# Patient Record
Sex: Male | Born: 1985
Health system: Southern US, Community
[De-identification: ages and names within clinical notes are randomized; demographics above are authoritative.]

---

## 2015-10-25 DIAGNOSIS — J3081 Allergic rhinitis due to animal (cat) (dog) hair and dander: Secondary | ICD-10-CM | POA: Diagnosis not present

## 2015-10-25 DIAGNOSIS — J301 Allergic rhinitis due to pollen: Secondary | ICD-10-CM | POA: Diagnosis not present

## 2015-10-25 DIAGNOSIS — J3089 Other allergic rhinitis: Secondary | ICD-10-CM | POA: Diagnosis not present

## 2015-11-01 DIAGNOSIS — J3081 Allergic rhinitis due to animal (cat) (dog) hair and dander: Secondary | ICD-10-CM | POA: Diagnosis not present

## 2015-11-01 DIAGNOSIS — J301 Allergic rhinitis due to pollen: Secondary | ICD-10-CM | POA: Diagnosis not present

## 2015-11-01 DIAGNOSIS — J3089 Other allergic rhinitis: Secondary | ICD-10-CM | POA: Diagnosis not present

## 2015-11-08 DIAGNOSIS — J3081 Allergic rhinitis due to animal (cat) (dog) hair and dander: Secondary | ICD-10-CM | POA: Diagnosis not present

## 2015-11-08 DIAGNOSIS — J3089 Other allergic rhinitis: Secondary | ICD-10-CM | POA: Diagnosis not present

## 2015-11-08 DIAGNOSIS — J301 Allergic rhinitis due to pollen: Secondary | ICD-10-CM | POA: Diagnosis not present

## 2015-11-15 DIAGNOSIS — J301 Allergic rhinitis due to pollen: Secondary | ICD-10-CM | POA: Diagnosis not present

## 2015-11-15 DIAGNOSIS — J3089 Other allergic rhinitis: Secondary | ICD-10-CM | POA: Diagnosis not present

## 2015-11-15 DIAGNOSIS — J3081 Allergic rhinitis due to animal (cat) (dog) hair and dander: Secondary | ICD-10-CM | POA: Diagnosis not present

## 2015-11-22 DIAGNOSIS — J301 Allergic rhinitis due to pollen: Secondary | ICD-10-CM | POA: Diagnosis not present

## 2015-11-22 DIAGNOSIS — J3089 Other allergic rhinitis: Secondary | ICD-10-CM | POA: Diagnosis not present

## 2015-11-22 DIAGNOSIS — J3081 Allergic rhinitis due to animal (cat) (dog) hair and dander: Secondary | ICD-10-CM | POA: Diagnosis not present

## 2015-11-29 DIAGNOSIS — J3081 Allergic rhinitis due to animal (cat) (dog) hair and dander: Secondary | ICD-10-CM | POA: Diagnosis not present

## 2015-11-29 DIAGNOSIS — J3089 Other allergic rhinitis: Secondary | ICD-10-CM | POA: Diagnosis not present

## 2015-11-29 DIAGNOSIS — J301 Allergic rhinitis due to pollen: Secondary | ICD-10-CM | POA: Diagnosis not present

## 2015-12-06 DIAGNOSIS — J3089 Other allergic rhinitis: Secondary | ICD-10-CM | POA: Diagnosis not present

## 2015-12-06 DIAGNOSIS — J301 Allergic rhinitis due to pollen: Secondary | ICD-10-CM | POA: Diagnosis not present

## 2015-12-06 DIAGNOSIS — J3081 Allergic rhinitis due to animal (cat) (dog) hair and dander: Secondary | ICD-10-CM | POA: Diagnosis not present

## 2015-12-13 DIAGNOSIS — J3081 Allergic rhinitis due to animal (cat) (dog) hair and dander: Secondary | ICD-10-CM | POA: Diagnosis not present

## 2015-12-13 DIAGNOSIS — J301 Allergic rhinitis due to pollen: Secondary | ICD-10-CM | POA: Diagnosis not present

## 2015-12-13 DIAGNOSIS — J3089 Other allergic rhinitis: Secondary | ICD-10-CM | POA: Diagnosis not present

## 2015-12-21 DIAGNOSIS — J301 Allergic rhinitis due to pollen: Secondary | ICD-10-CM | POA: Diagnosis not present

## 2015-12-21 DIAGNOSIS — J3081 Allergic rhinitis due to animal (cat) (dog) hair and dander: Secondary | ICD-10-CM | POA: Diagnosis not present

## 2015-12-21 DIAGNOSIS — J3089 Other allergic rhinitis: Secondary | ICD-10-CM | POA: Diagnosis not present

## 2015-12-27 DIAGNOSIS — J3081 Allergic rhinitis due to animal (cat) (dog) hair and dander: Secondary | ICD-10-CM | POA: Diagnosis not present

## 2015-12-27 DIAGNOSIS — J301 Allergic rhinitis due to pollen: Secondary | ICD-10-CM | POA: Diagnosis not present

## 2015-12-27 DIAGNOSIS — J3089 Other allergic rhinitis: Secondary | ICD-10-CM | POA: Diagnosis not present

## 2015-12-31 DIAGNOSIS — H1045 Other chronic allergic conjunctivitis: Secondary | ICD-10-CM | POA: Diagnosis not present

## 2015-12-31 DIAGNOSIS — J301 Allergic rhinitis due to pollen: Secondary | ICD-10-CM | POA: Diagnosis not present

## 2015-12-31 DIAGNOSIS — J3081 Allergic rhinitis due to animal (cat) (dog) hair and dander: Secondary | ICD-10-CM | POA: Diagnosis not present

## 2015-12-31 DIAGNOSIS — J3089 Other allergic rhinitis: Secondary | ICD-10-CM | POA: Diagnosis not present

## 2016-01-03 DIAGNOSIS — J3081 Allergic rhinitis due to animal (cat) (dog) hair and dander: Secondary | ICD-10-CM | POA: Diagnosis not present

## 2016-01-03 DIAGNOSIS — J3089 Other allergic rhinitis: Secondary | ICD-10-CM | POA: Diagnosis not present

## 2016-01-03 DIAGNOSIS — J301 Allergic rhinitis due to pollen: Secondary | ICD-10-CM | POA: Diagnosis not present

## 2016-01-10 DIAGNOSIS — J3089 Other allergic rhinitis: Secondary | ICD-10-CM | POA: Diagnosis not present

## 2016-01-10 DIAGNOSIS — J301 Allergic rhinitis due to pollen: Secondary | ICD-10-CM | POA: Diagnosis not present

## 2016-01-10 DIAGNOSIS — J3081 Allergic rhinitis due to animal (cat) (dog) hair and dander: Secondary | ICD-10-CM | POA: Diagnosis not present

## 2016-01-13 DIAGNOSIS — J301 Allergic rhinitis due to pollen: Secondary | ICD-10-CM | POA: Diagnosis not present

## 2016-01-13 DIAGNOSIS — J3081 Allergic rhinitis due to animal (cat) (dog) hair and dander: Secondary | ICD-10-CM | POA: Diagnosis not present

## 2016-01-13 DIAGNOSIS — J3089 Other allergic rhinitis: Secondary | ICD-10-CM | POA: Diagnosis not present

## 2016-01-17 DIAGNOSIS — J301 Allergic rhinitis due to pollen: Secondary | ICD-10-CM | POA: Diagnosis not present

## 2016-01-17 DIAGNOSIS — J3081 Allergic rhinitis due to animal (cat) (dog) hair and dander: Secondary | ICD-10-CM | POA: Diagnosis not present

## 2016-01-17 DIAGNOSIS — J3089 Other allergic rhinitis: Secondary | ICD-10-CM | POA: Diagnosis not present

## 2016-01-26 DIAGNOSIS — J3081 Allergic rhinitis due to animal (cat) (dog) hair and dander: Secondary | ICD-10-CM | POA: Diagnosis not present

## 2016-01-26 DIAGNOSIS — J301 Allergic rhinitis due to pollen: Secondary | ICD-10-CM | POA: Diagnosis not present

## 2016-01-26 DIAGNOSIS — J3089 Other allergic rhinitis: Secondary | ICD-10-CM | POA: Diagnosis not present

## 2016-01-31 DIAGNOSIS — J301 Allergic rhinitis due to pollen: Secondary | ICD-10-CM | POA: Diagnosis not present

## 2016-01-31 DIAGNOSIS — J3081 Allergic rhinitis due to animal (cat) (dog) hair and dander: Secondary | ICD-10-CM | POA: Diagnosis not present

## 2016-01-31 DIAGNOSIS — J3089 Other allergic rhinitis: Secondary | ICD-10-CM | POA: Diagnosis not present

## 2016-02-07 DIAGNOSIS — J3081 Allergic rhinitis due to animal (cat) (dog) hair and dander: Secondary | ICD-10-CM | POA: Diagnosis not present

## 2016-02-07 DIAGNOSIS — J3089 Other allergic rhinitis: Secondary | ICD-10-CM | POA: Diagnosis not present

## 2016-02-07 DIAGNOSIS — J301 Allergic rhinitis due to pollen: Secondary | ICD-10-CM | POA: Diagnosis not present

## 2016-02-09 DIAGNOSIS — J3089 Other allergic rhinitis: Secondary | ICD-10-CM | POA: Diagnosis not present

## 2016-02-09 DIAGNOSIS — J301 Allergic rhinitis due to pollen: Secondary | ICD-10-CM | POA: Diagnosis not present

## 2016-02-09 DIAGNOSIS — J3081 Allergic rhinitis due to animal (cat) (dog) hair and dander: Secondary | ICD-10-CM | POA: Diagnosis not present

## 2016-02-11 DIAGNOSIS — J301 Allergic rhinitis due to pollen: Secondary | ICD-10-CM | POA: Diagnosis not present

## 2016-02-11 DIAGNOSIS — J3081 Allergic rhinitis due to animal (cat) (dog) hair and dander: Secondary | ICD-10-CM | POA: Diagnosis not present

## 2016-02-11 DIAGNOSIS — J3089 Other allergic rhinitis: Secondary | ICD-10-CM | POA: Diagnosis not present

## 2016-02-21 DIAGNOSIS — J301 Allergic rhinitis due to pollen: Secondary | ICD-10-CM | POA: Diagnosis not present

## 2016-02-21 DIAGNOSIS — J3089 Other allergic rhinitis: Secondary | ICD-10-CM | POA: Diagnosis not present

## 2016-02-21 DIAGNOSIS — J3081 Allergic rhinitis due to animal (cat) (dog) hair and dander: Secondary | ICD-10-CM | POA: Diagnosis not present

## 2016-02-28 DIAGNOSIS — J3081 Allergic rhinitis due to animal (cat) (dog) hair and dander: Secondary | ICD-10-CM | POA: Diagnosis not present

## 2016-02-28 DIAGNOSIS — J301 Allergic rhinitis due to pollen: Secondary | ICD-10-CM | POA: Diagnosis not present

## 2016-02-28 DIAGNOSIS — J3089 Other allergic rhinitis: Secondary | ICD-10-CM | POA: Diagnosis not present

## 2016-03-06 DIAGNOSIS — J301 Allergic rhinitis due to pollen: Secondary | ICD-10-CM | POA: Diagnosis not present

## 2016-03-06 DIAGNOSIS — J3081 Allergic rhinitis due to animal (cat) (dog) hair and dander: Secondary | ICD-10-CM | POA: Diagnosis not present

## 2016-03-06 DIAGNOSIS — J3089 Other allergic rhinitis: Secondary | ICD-10-CM | POA: Diagnosis not present

## 2016-03-13 DIAGNOSIS — J3081 Allergic rhinitis due to animal (cat) (dog) hair and dander: Secondary | ICD-10-CM | POA: Diagnosis not present

## 2016-03-13 DIAGNOSIS — J3089 Other allergic rhinitis: Secondary | ICD-10-CM | POA: Diagnosis not present

## 2016-03-13 DIAGNOSIS — J301 Allergic rhinitis due to pollen: Secondary | ICD-10-CM | POA: Diagnosis not present

## 2016-03-14 DIAGNOSIS — J301 Allergic rhinitis due to pollen: Secondary | ICD-10-CM | POA: Diagnosis not present

## 2016-03-14 DIAGNOSIS — J3081 Allergic rhinitis due to animal (cat) (dog) hair and dander: Secondary | ICD-10-CM | POA: Diagnosis not present

## 2016-03-15 DIAGNOSIS — J3089 Other allergic rhinitis: Secondary | ICD-10-CM | POA: Diagnosis not present

## 2016-03-20 DIAGNOSIS — J301 Allergic rhinitis due to pollen: Secondary | ICD-10-CM | POA: Diagnosis not present

## 2016-03-20 DIAGNOSIS — J3081 Allergic rhinitis due to animal (cat) (dog) hair and dander: Secondary | ICD-10-CM | POA: Diagnosis not present

## 2016-03-20 DIAGNOSIS — J3089 Other allergic rhinitis: Secondary | ICD-10-CM | POA: Diagnosis not present

## 2016-03-28 DIAGNOSIS — J3089 Other allergic rhinitis: Secondary | ICD-10-CM | POA: Diagnosis not present

## 2016-03-28 DIAGNOSIS — J3081 Allergic rhinitis due to animal (cat) (dog) hair and dander: Secondary | ICD-10-CM | POA: Diagnosis not present

## 2016-03-28 DIAGNOSIS — J301 Allergic rhinitis due to pollen: Secondary | ICD-10-CM | POA: Diagnosis not present

## 2016-04-03 DIAGNOSIS — J3081 Allergic rhinitis due to animal (cat) (dog) hair and dander: Secondary | ICD-10-CM | POA: Diagnosis not present

## 2016-04-03 DIAGNOSIS — J3089 Other allergic rhinitis: Secondary | ICD-10-CM | POA: Diagnosis not present

## 2016-04-03 DIAGNOSIS — J301 Allergic rhinitis due to pollen: Secondary | ICD-10-CM | POA: Diagnosis not present

## 2016-04-10 DIAGNOSIS — J3089 Other allergic rhinitis: Secondary | ICD-10-CM | POA: Diagnosis not present

## 2016-04-10 DIAGNOSIS — J3081 Allergic rhinitis due to animal (cat) (dog) hair and dander: Secondary | ICD-10-CM | POA: Diagnosis not present

## 2016-04-10 DIAGNOSIS — J301 Allergic rhinitis due to pollen: Secondary | ICD-10-CM | POA: Diagnosis not present

## 2016-04-17 DIAGNOSIS — J3089 Other allergic rhinitis: Secondary | ICD-10-CM | POA: Diagnosis not present

## 2016-04-17 DIAGNOSIS — J301 Allergic rhinitis due to pollen: Secondary | ICD-10-CM | POA: Diagnosis not present

## 2016-04-17 DIAGNOSIS — J3081 Allergic rhinitis due to animal (cat) (dog) hair and dander: Secondary | ICD-10-CM | POA: Diagnosis not present

## 2016-04-24 DIAGNOSIS — J3081 Allergic rhinitis due to animal (cat) (dog) hair and dander: Secondary | ICD-10-CM | POA: Diagnosis not present

## 2016-04-24 DIAGNOSIS — J301 Allergic rhinitis due to pollen: Secondary | ICD-10-CM | POA: Diagnosis not present

## 2016-04-24 DIAGNOSIS — J3089 Other allergic rhinitis: Secondary | ICD-10-CM | POA: Diagnosis not present

## 2016-05-01 DIAGNOSIS — J301 Allergic rhinitis due to pollen: Secondary | ICD-10-CM | POA: Diagnosis not present

## 2016-05-01 DIAGNOSIS — J3081 Allergic rhinitis due to animal (cat) (dog) hair and dander: Secondary | ICD-10-CM | POA: Diagnosis not present

## 2016-05-01 DIAGNOSIS — J3089 Other allergic rhinitis: Secondary | ICD-10-CM | POA: Diagnosis not present

## 2016-05-08 DIAGNOSIS — J3089 Other allergic rhinitis: Secondary | ICD-10-CM | POA: Diagnosis not present

## 2016-05-08 DIAGNOSIS — J3081 Allergic rhinitis due to animal (cat) (dog) hair and dander: Secondary | ICD-10-CM | POA: Diagnosis not present

## 2016-05-08 DIAGNOSIS — J301 Allergic rhinitis due to pollen: Secondary | ICD-10-CM | POA: Diagnosis not present

## 2016-05-15 DIAGNOSIS — J301 Allergic rhinitis due to pollen: Secondary | ICD-10-CM | POA: Diagnosis not present

## 2016-05-15 DIAGNOSIS — J3081 Allergic rhinitis due to animal (cat) (dog) hair and dander: Secondary | ICD-10-CM | POA: Diagnosis not present

## 2016-05-15 DIAGNOSIS — J3089 Other allergic rhinitis: Secondary | ICD-10-CM | POA: Diagnosis not present

## 2016-05-22 DIAGNOSIS — J301 Allergic rhinitis due to pollen: Secondary | ICD-10-CM | POA: Diagnosis not present

## 2016-05-22 DIAGNOSIS — J3089 Other allergic rhinitis: Secondary | ICD-10-CM | POA: Diagnosis not present

## 2016-05-22 DIAGNOSIS — J3081 Allergic rhinitis due to animal (cat) (dog) hair and dander: Secondary | ICD-10-CM | POA: Diagnosis not present

## 2016-05-29 DIAGNOSIS — J3089 Other allergic rhinitis: Secondary | ICD-10-CM | POA: Diagnosis not present

## 2016-05-29 DIAGNOSIS — J3081 Allergic rhinitis due to animal (cat) (dog) hair and dander: Secondary | ICD-10-CM | POA: Diagnosis not present

## 2016-05-29 DIAGNOSIS — J301 Allergic rhinitis due to pollen: Secondary | ICD-10-CM | POA: Diagnosis not present

## 2016-06-05 DIAGNOSIS — J3089 Other allergic rhinitis: Secondary | ICD-10-CM | POA: Diagnosis not present

## 2016-06-05 DIAGNOSIS — J301 Allergic rhinitis due to pollen: Secondary | ICD-10-CM | POA: Diagnosis not present

## 2016-06-05 DIAGNOSIS — J3081 Allergic rhinitis due to animal (cat) (dog) hair and dander: Secondary | ICD-10-CM | POA: Diagnosis not present

## 2016-06-12 DIAGNOSIS — J301 Allergic rhinitis due to pollen: Secondary | ICD-10-CM | POA: Diagnosis not present

## 2016-06-12 DIAGNOSIS — J3089 Other allergic rhinitis: Secondary | ICD-10-CM | POA: Diagnosis not present

## 2016-06-12 DIAGNOSIS — J3081 Allergic rhinitis due to animal (cat) (dog) hair and dander: Secondary | ICD-10-CM | POA: Diagnosis not present

## 2016-06-13 DIAGNOSIS — J302 Other seasonal allergic rhinitis: Secondary | ICD-10-CM | POA: Diagnosis not present

## 2016-06-13 DIAGNOSIS — R030 Elevated blood-pressure reading, without diagnosis of hypertension: Secondary | ICD-10-CM | POA: Diagnosis not present

## 2016-06-13 DIAGNOSIS — F900 Attention-deficit hyperactivity disorder, predominantly inattentive type: Secondary | ICD-10-CM | POA: Diagnosis not present

## 2016-06-13 DIAGNOSIS — Z0000 Encounter for general adult medical examination without abnormal findings: Secondary | ICD-10-CM | POA: Diagnosis not present

## 2016-06-13 DIAGNOSIS — R509 Fever, unspecified: Secondary | ICD-10-CM | POA: Diagnosis not present

## 2016-06-19 DIAGNOSIS — J3089 Other allergic rhinitis: Secondary | ICD-10-CM | POA: Diagnosis not present

## 2016-06-19 DIAGNOSIS — J3081 Allergic rhinitis due to animal (cat) (dog) hair and dander: Secondary | ICD-10-CM | POA: Diagnosis not present

## 2016-06-19 DIAGNOSIS — J301 Allergic rhinitis due to pollen: Secondary | ICD-10-CM | POA: Diagnosis not present

## 2016-06-27 DIAGNOSIS — J3089 Other allergic rhinitis: Secondary | ICD-10-CM | POA: Diagnosis not present

## 2016-06-27 DIAGNOSIS — J3081 Allergic rhinitis due to animal (cat) (dog) hair and dander: Secondary | ICD-10-CM | POA: Diagnosis not present

## 2016-06-27 DIAGNOSIS — J301 Allergic rhinitis due to pollen: Secondary | ICD-10-CM | POA: Diagnosis not present

## 2016-07-03 DIAGNOSIS — J301 Allergic rhinitis due to pollen: Secondary | ICD-10-CM | POA: Diagnosis not present

## 2016-07-03 DIAGNOSIS — J3089 Other allergic rhinitis: Secondary | ICD-10-CM | POA: Diagnosis not present

## 2016-07-03 DIAGNOSIS — J3081 Allergic rhinitis due to animal (cat) (dog) hair and dander: Secondary | ICD-10-CM | POA: Diagnosis not present

## 2016-07-10 DIAGNOSIS — J301 Allergic rhinitis due to pollen: Secondary | ICD-10-CM | POA: Diagnosis not present

## 2016-07-10 DIAGNOSIS — J3089 Other allergic rhinitis: Secondary | ICD-10-CM | POA: Diagnosis not present

## 2016-07-10 DIAGNOSIS — J3081 Allergic rhinitis due to animal (cat) (dog) hair and dander: Secondary | ICD-10-CM | POA: Diagnosis not present

## 2016-07-14 DIAGNOSIS — J3089 Other allergic rhinitis: Secondary | ICD-10-CM | POA: Diagnosis not present

## 2016-07-14 DIAGNOSIS — J301 Allergic rhinitis due to pollen: Secondary | ICD-10-CM | POA: Diagnosis not present

## 2016-07-14 DIAGNOSIS — J3081 Allergic rhinitis due to animal (cat) (dog) hair and dander: Secondary | ICD-10-CM | POA: Diagnosis not present

## 2016-07-21 DIAGNOSIS — J3081 Allergic rhinitis due to animal (cat) (dog) hair and dander: Secondary | ICD-10-CM | POA: Diagnosis not present

## 2016-07-21 DIAGNOSIS — J3089 Other allergic rhinitis: Secondary | ICD-10-CM | POA: Diagnosis not present

## 2016-07-21 DIAGNOSIS — J301 Allergic rhinitis due to pollen: Secondary | ICD-10-CM | POA: Diagnosis not present

## 2016-07-31 DIAGNOSIS — J3081 Allergic rhinitis due to animal (cat) (dog) hair and dander: Secondary | ICD-10-CM | POA: Diagnosis not present

## 2016-07-31 DIAGNOSIS — J301 Allergic rhinitis due to pollen: Secondary | ICD-10-CM | POA: Diagnosis not present

## 2016-07-31 DIAGNOSIS — J3089 Other allergic rhinitis: Secondary | ICD-10-CM | POA: Diagnosis not present

## 2016-08-04 DIAGNOSIS — Z6827 Body mass index (BMI) 27.0-27.9, adult: Secondary | ICD-10-CM | POA: Diagnosis not present

## 2016-08-04 DIAGNOSIS — R030 Elevated blood-pressure reading, without diagnosis of hypertension: Secondary | ICD-10-CM | POA: Diagnosis not present

## 2016-08-07 DIAGNOSIS — J301 Allergic rhinitis due to pollen: Secondary | ICD-10-CM | POA: Diagnosis not present

## 2016-08-07 DIAGNOSIS — J3089 Other allergic rhinitis: Secondary | ICD-10-CM | POA: Diagnosis not present

## 2016-08-07 DIAGNOSIS — J3081 Allergic rhinitis due to animal (cat) (dog) hair and dander: Secondary | ICD-10-CM | POA: Diagnosis not present

## 2016-08-14 DIAGNOSIS — J3081 Allergic rhinitis due to animal (cat) (dog) hair and dander: Secondary | ICD-10-CM | POA: Diagnosis not present

## 2016-08-14 DIAGNOSIS — J3089 Other allergic rhinitis: Secondary | ICD-10-CM | POA: Diagnosis not present

## 2016-08-14 DIAGNOSIS — J301 Allergic rhinitis due to pollen: Secondary | ICD-10-CM | POA: Diagnosis not present

## 2016-08-21 DIAGNOSIS — J3089 Other allergic rhinitis: Secondary | ICD-10-CM | POA: Diagnosis not present

## 2016-08-21 DIAGNOSIS — J3081 Allergic rhinitis due to animal (cat) (dog) hair and dander: Secondary | ICD-10-CM | POA: Diagnosis not present

## 2016-08-21 DIAGNOSIS — J301 Allergic rhinitis due to pollen: Secondary | ICD-10-CM | POA: Diagnosis not present

## 2016-08-28 DIAGNOSIS — J3081 Allergic rhinitis due to animal (cat) (dog) hair and dander: Secondary | ICD-10-CM | POA: Diagnosis not present

## 2016-08-28 DIAGNOSIS — J301 Allergic rhinitis due to pollen: Secondary | ICD-10-CM | POA: Diagnosis not present

## 2016-08-28 DIAGNOSIS — J3089 Other allergic rhinitis: Secondary | ICD-10-CM | POA: Diagnosis not present

## 2016-08-29 DIAGNOSIS — J3081 Allergic rhinitis due to animal (cat) (dog) hair and dander: Secondary | ICD-10-CM | POA: Diagnosis not present

## 2016-08-29 DIAGNOSIS — J301 Allergic rhinitis due to pollen: Secondary | ICD-10-CM | POA: Diagnosis not present

## 2016-08-30 DIAGNOSIS — J3089 Other allergic rhinitis: Secondary | ICD-10-CM | POA: Diagnosis not present

## 2016-09-04 DIAGNOSIS — J301 Allergic rhinitis due to pollen: Secondary | ICD-10-CM | POA: Diagnosis not present

## 2016-09-04 DIAGNOSIS — J3089 Other allergic rhinitis: Secondary | ICD-10-CM | POA: Diagnosis not present

## 2016-09-04 DIAGNOSIS — J3081 Allergic rhinitis due to animal (cat) (dog) hair and dander: Secondary | ICD-10-CM | POA: Diagnosis not present

## 2016-09-06 DIAGNOSIS — J019 Acute sinusitis, unspecified: Secondary | ICD-10-CM | POA: Diagnosis not present

## 2016-09-06 DIAGNOSIS — R42 Dizziness and giddiness: Secondary | ICD-10-CM | POA: Diagnosis not present

## 2016-09-06 DIAGNOSIS — I10 Essential (primary) hypertension: Secondary | ICD-10-CM | POA: Diagnosis not present

## 2016-09-06 DIAGNOSIS — Z6827 Body mass index (BMI) 27.0-27.9, adult: Secondary | ICD-10-CM | POA: Diagnosis not present

## 2016-09-11 DIAGNOSIS — J3089 Other allergic rhinitis: Secondary | ICD-10-CM | POA: Diagnosis not present

## 2016-09-11 DIAGNOSIS — J301 Allergic rhinitis due to pollen: Secondary | ICD-10-CM | POA: Diagnosis not present

## 2016-09-11 DIAGNOSIS — J3081 Allergic rhinitis due to animal (cat) (dog) hair and dander: Secondary | ICD-10-CM | POA: Diagnosis not present

## 2016-09-18 DIAGNOSIS — J3089 Other allergic rhinitis: Secondary | ICD-10-CM | POA: Diagnosis not present

## 2016-09-18 DIAGNOSIS — J301 Allergic rhinitis due to pollen: Secondary | ICD-10-CM | POA: Diagnosis not present

## 2016-09-18 DIAGNOSIS — J3081 Allergic rhinitis due to animal (cat) (dog) hair and dander: Secondary | ICD-10-CM | POA: Diagnosis not present

## 2016-09-22 DIAGNOSIS — Z6827 Body mass index (BMI) 27.0-27.9, adult: Secondary | ICD-10-CM | POA: Diagnosis not present

## 2016-09-22 DIAGNOSIS — R42 Dizziness and giddiness: Secondary | ICD-10-CM | POA: Diagnosis not present

## 2016-09-22 DIAGNOSIS — I10 Essential (primary) hypertension: Secondary | ICD-10-CM | POA: Diagnosis not present

## 2016-09-25 DIAGNOSIS — J3081 Allergic rhinitis due to animal (cat) (dog) hair and dander: Secondary | ICD-10-CM | POA: Diagnosis not present

## 2016-09-25 DIAGNOSIS — J3089 Other allergic rhinitis: Secondary | ICD-10-CM | POA: Diagnosis not present

## 2016-09-25 DIAGNOSIS — J301 Allergic rhinitis due to pollen: Secondary | ICD-10-CM | POA: Diagnosis not present

## 2016-09-28 DIAGNOSIS — J3089 Other allergic rhinitis: Secondary | ICD-10-CM | POA: Diagnosis not present

## 2016-09-28 DIAGNOSIS — J301 Allergic rhinitis due to pollen: Secondary | ICD-10-CM | POA: Diagnosis not present

## 2016-09-28 DIAGNOSIS — J3081 Allergic rhinitis due to animal (cat) (dog) hair and dander: Secondary | ICD-10-CM | POA: Diagnosis not present

## 2016-10-04 DIAGNOSIS — J3089 Other allergic rhinitis: Secondary | ICD-10-CM | POA: Diagnosis not present

## 2016-10-04 DIAGNOSIS — J301 Allergic rhinitis due to pollen: Secondary | ICD-10-CM | POA: Diagnosis not present

## 2016-10-04 DIAGNOSIS — J3081 Allergic rhinitis due to animal (cat) (dog) hair and dander: Secondary | ICD-10-CM | POA: Diagnosis not present

## 2016-10-11 DIAGNOSIS — J3089 Other allergic rhinitis: Secondary | ICD-10-CM | POA: Diagnosis not present

## 2016-10-11 DIAGNOSIS — J301 Allergic rhinitis due to pollen: Secondary | ICD-10-CM | POA: Diagnosis not present

## 2016-10-11 DIAGNOSIS — J3081 Allergic rhinitis due to animal (cat) (dog) hair and dander: Secondary | ICD-10-CM | POA: Diagnosis not present

## 2016-10-16 DIAGNOSIS — J3089 Other allergic rhinitis: Secondary | ICD-10-CM | POA: Diagnosis not present

## 2016-10-16 DIAGNOSIS — J301 Allergic rhinitis due to pollen: Secondary | ICD-10-CM | POA: Diagnosis not present

## 2016-10-16 DIAGNOSIS — J3081 Allergic rhinitis due to animal (cat) (dog) hair and dander: Secondary | ICD-10-CM | POA: Diagnosis not present

## 2016-10-23 DIAGNOSIS — J301 Allergic rhinitis due to pollen: Secondary | ICD-10-CM | POA: Diagnosis not present

## 2016-10-23 DIAGNOSIS — J3089 Other allergic rhinitis: Secondary | ICD-10-CM | POA: Diagnosis not present

## 2016-10-23 DIAGNOSIS — J3081 Allergic rhinitis due to animal (cat) (dog) hair and dander: Secondary | ICD-10-CM | POA: Diagnosis not present

## 2016-10-30 DIAGNOSIS — J301 Allergic rhinitis due to pollen: Secondary | ICD-10-CM | POA: Diagnosis not present

## 2016-10-30 DIAGNOSIS — J3081 Allergic rhinitis due to animal (cat) (dog) hair and dander: Secondary | ICD-10-CM | POA: Diagnosis not present

## 2016-10-30 DIAGNOSIS — J3089 Other allergic rhinitis: Secondary | ICD-10-CM | POA: Diagnosis not present

## 2016-11-06 DIAGNOSIS — J3089 Other allergic rhinitis: Secondary | ICD-10-CM | POA: Diagnosis not present

## 2016-11-06 DIAGNOSIS — J301 Allergic rhinitis due to pollen: Secondary | ICD-10-CM | POA: Diagnosis not present

## 2016-11-06 DIAGNOSIS — J3081 Allergic rhinitis due to animal (cat) (dog) hair and dander: Secondary | ICD-10-CM | POA: Diagnosis not present

## 2016-11-13 DIAGNOSIS — J3081 Allergic rhinitis due to animal (cat) (dog) hair and dander: Secondary | ICD-10-CM | POA: Diagnosis not present

## 2016-11-13 DIAGNOSIS — J3089 Other allergic rhinitis: Secondary | ICD-10-CM | POA: Diagnosis not present

## 2016-11-13 DIAGNOSIS — J301 Allergic rhinitis due to pollen: Secondary | ICD-10-CM | POA: Diagnosis not present

## 2016-11-20 DIAGNOSIS — J3081 Allergic rhinitis due to animal (cat) (dog) hair and dander: Secondary | ICD-10-CM | POA: Diagnosis not present

## 2016-11-20 DIAGNOSIS — J301 Allergic rhinitis due to pollen: Secondary | ICD-10-CM | POA: Diagnosis not present

## 2016-11-20 DIAGNOSIS — J3089 Other allergic rhinitis: Secondary | ICD-10-CM | POA: Diagnosis not present

## 2016-11-27 DIAGNOSIS — J301 Allergic rhinitis due to pollen: Secondary | ICD-10-CM | POA: Diagnosis not present

## 2016-11-27 DIAGNOSIS — J3089 Other allergic rhinitis: Secondary | ICD-10-CM | POA: Diagnosis not present

## 2016-11-27 DIAGNOSIS — J3081 Allergic rhinitis due to animal (cat) (dog) hair and dander: Secondary | ICD-10-CM | POA: Diagnosis not present

## 2016-12-04 DIAGNOSIS — J3089 Other allergic rhinitis: Secondary | ICD-10-CM | POA: Diagnosis not present

## 2016-12-04 DIAGNOSIS — J301 Allergic rhinitis due to pollen: Secondary | ICD-10-CM | POA: Diagnosis not present

## 2016-12-04 DIAGNOSIS — J3081 Allergic rhinitis due to animal (cat) (dog) hair and dander: Secondary | ICD-10-CM | POA: Diagnosis not present

## 2016-12-11 DIAGNOSIS — J301 Allergic rhinitis due to pollen: Secondary | ICD-10-CM | POA: Diagnosis not present

## 2016-12-11 DIAGNOSIS — J3089 Other allergic rhinitis: Secondary | ICD-10-CM | POA: Diagnosis not present

## 2016-12-11 DIAGNOSIS — J3081 Allergic rhinitis due to animal (cat) (dog) hair and dander: Secondary | ICD-10-CM | POA: Diagnosis not present

## 2016-12-19 DIAGNOSIS — J301 Allergic rhinitis due to pollen: Secondary | ICD-10-CM | POA: Diagnosis not present

## 2016-12-19 DIAGNOSIS — J3081 Allergic rhinitis due to animal (cat) (dog) hair and dander: Secondary | ICD-10-CM | POA: Diagnosis not present

## 2016-12-19 DIAGNOSIS — J3089 Other allergic rhinitis: Secondary | ICD-10-CM | POA: Diagnosis not present

## 2016-12-21 DIAGNOSIS — I10 Essential (primary) hypertension: Secondary | ICD-10-CM | POA: Diagnosis not present

## 2016-12-21 DIAGNOSIS — Z0000 Encounter for general adult medical examination without abnormal findings: Secondary | ICD-10-CM | POA: Diagnosis not present

## 2016-12-25 DIAGNOSIS — J3081 Allergic rhinitis due to animal (cat) (dog) hair and dander: Secondary | ICD-10-CM | POA: Diagnosis not present

## 2016-12-25 DIAGNOSIS — J301 Allergic rhinitis due to pollen: Secondary | ICD-10-CM | POA: Diagnosis not present

## 2016-12-25 DIAGNOSIS — J3089 Other allergic rhinitis: Secondary | ICD-10-CM | POA: Diagnosis not present

## 2016-12-27 DIAGNOSIS — Z23 Encounter for immunization: Secondary | ICD-10-CM | POA: Diagnosis not present

## 2016-12-27 DIAGNOSIS — R42 Dizziness and giddiness: Secondary | ICD-10-CM | POA: Diagnosis not present

## 2016-12-27 DIAGNOSIS — Z0000 Encounter for general adult medical examination without abnormal findings: Secondary | ICD-10-CM | POA: Diagnosis not present

## 2016-12-27 DIAGNOSIS — E663 Overweight: Secondary | ICD-10-CM | POA: Diagnosis not present

## 2016-12-27 DIAGNOSIS — I10 Essential (primary) hypertension: Secondary | ICD-10-CM | POA: Diagnosis not present

## 2016-12-27 DIAGNOSIS — Z1389 Encounter for screening for other disorder: Secondary | ICD-10-CM | POA: Diagnosis not present

## 2016-12-27 DIAGNOSIS — J302 Other seasonal allergic rhinitis: Secondary | ICD-10-CM | POA: Diagnosis not present

## 2017-01-01 DIAGNOSIS — J3089 Other allergic rhinitis: Secondary | ICD-10-CM | POA: Diagnosis not present

## 2017-01-01 DIAGNOSIS — J3081 Allergic rhinitis due to animal (cat) (dog) hair and dander: Secondary | ICD-10-CM | POA: Diagnosis not present

## 2017-01-01 DIAGNOSIS — J301 Allergic rhinitis due to pollen: Secondary | ICD-10-CM | POA: Diagnosis not present

## 2017-01-08 DIAGNOSIS — J301 Allergic rhinitis due to pollen: Secondary | ICD-10-CM | POA: Diagnosis not present

## 2017-01-08 DIAGNOSIS — J3089 Other allergic rhinitis: Secondary | ICD-10-CM | POA: Diagnosis not present

## 2017-01-08 DIAGNOSIS — J3081 Allergic rhinitis due to animal (cat) (dog) hair and dander: Secondary | ICD-10-CM | POA: Diagnosis not present

## 2017-01-15 DIAGNOSIS — J3089 Other allergic rhinitis: Secondary | ICD-10-CM | POA: Diagnosis not present

## 2017-01-15 DIAGNOSIS — J301 Allergic rhinitis due to pollen: Secondary | ICD-10-CM | POA: Diagnosis not present

## 2017-01-15 DIAGNOSIS — J3081 Allergic rhinitis due to animal (cat) (dog) hair and dander: Secondary | ICD-10-CM | POA: Diagnosis not present

## 2017-01-17 DIAGNOSIS — J3081 Allergic rhinitis due to animal (cat) (dog) hair and dander: Secondary | ICD-10-CM | POA: Diagnosis not present

## 2017-01-17 DIAGNOSIS — J3089 Other allergic rhinitis: Secondary | ICD-10-CM | POA: Diagnosis not present

## 2017-01-17 DIAGNOSIS — J301 Allergic rhinitis due to pollen: Secondary | ICD-10-CM | POA: Diagnosis not present

## 2017-01-22 DIAGNOSIS — J301 Allergic rhinitis due to pollen: Secondary | ICD-10-CM | POA: Diagnosis not present

## 2017-01-22 DIAGNOSIS — J3089 Other allergic rhinitis: Secondary | ICD-10-CM | POA: Diagnosis not present

## 2017-01-22 DIAGNOSIS — J3081 Allergic rhinitis due to animal (cat) (dog) hair and dander: Secondary | ICD-10-CM | POA: Diagnosis not present

## 2017-01-29 DIAGNOSIS — J3089 Other allergic rhinitis: Secondary | ICD-10-CM | POA: Diagnosis not present

## 2017-01-29 DIAGNOSIS — J3081 Allergic rhinitis due to animal (cat) (dog) hair and dander: Secondary | ICD-10-CM | POA: Diagnosis not present

## 2017-01-29 DIAGNOSIS — J301 Allergic rhinitis due to pollen: Secondary | ICD-10-CM | POA: Diagnosis not present

## 2017-02-05 DIAGNOSIS — J3089 Other allergic rhinitis: Secondary | ICD-10-CM | POA: Diagnosis not present

## 2017-02-05 DIAGNOSIS — J3081 Allergic rhinitis due to animal (cat) (dog) hair and dander: Secondary | ICD-10-CM | POA: Diagnosis not present

## 2017-02-05 DIAGNOSIS — J301 Allergic rhinitis due to pollen: Secondary | ICD-10-CM | POA: Diagnosis not present

## 2017-02-12 DIAGNOSIS — J3089 Other allergic rhinitis: Secondary | ICD-10-CM | POA: Diagnosis not present

## 2017-02-12 DIAGNOSIS — J3081 Allergic rhinitis due to animal (cat) (dog) hair and dander: Secondary | ICD-10-CM | POA: Diagnosis not present

## 2017-02-12 DIAGNOSIS — J301 Allergic rhinitis due to pollen: Secondary | ICD-10-CM | POA: Diagnosis not present

## 2017-02-19 DIAGNOSIS — J3089 Other allergic rhinitis: Secondary | ICD-10-CM | POA: Diagnosis not present

## 2017-02-19 DIAGNOSIS — J301 Allergic rhinitis due to pollen: Secondary | ICD-10-CM | POA: Diagnosis not present

## 2017-02-19 DIAGNOSIS — J3081 Allergic rhinitis due to animal (cat) (dog) hair and dander: Secondary | ICD-10-CM | POA: Diagnosis not present

## 2017-02-26 DIAGNOSIS — J3081 Allergic rhinitis due to animal (cat) (dog) hair and dander: Secondary | ICD-10-CM | POA: Diagnosis not present

## 2017-02-26 DIAGNOSIS — J301 Allergic rhinitis due to pollen: Secondary | ICD-10-CM | POA: Diagnosis not present

## 2017-02-26 DIAGNOSIS — J3089 Other allergic rhinitis: Secondary | ICD-10-CM | POA: Diagnosis not present

## 2017-03-05 DIAGNOSIS — J301 Allergic rhinitis due to pollen: Secondary | ICD-10-CM | POA: Diagnosis not present

## 2017-03-05 DIAGNOSIS — J3081 Allergic rhinitis due to animal (cat) (dog) hair and dander: Secondary | ICD-10-CM | POA: Diagnosis not present

## 2017-03-05 DIAGNOSIS — J3089 Other allergic rhinitis: Secondary | ICD-10-CM | POA: Diagnosis not present

## 2017-03-12 DIAGNOSIS — J3081 Allergic rhinitis due to animal (cat) (dog) hair and dander: Secondary | ICD-10-CM | POA: Diagnosis not present

## 2017-03-12 DIAGNOSIS — J3089 Other allergic rhinitis: Secondary | ICD-10-CM | POA: Diagnosis not present

## 2017-03-12 DIAGNOSIS — J301 Allergic rhinitis due to pollen: Secondary | ICD-10-CM | POA: Diagnosis not present

## 2017-03-15 DIAGNOSIS — J3081 Allergic rhinitis due to animal (cat) (dog) hair and dander: Secondary | ICD-10-CM | POA: Diagnosis not present

## 2017-03-15 DIAGNOSIS — J3089 Other allergic rhinitis: Secondary | ICD-10-CM | POA: Diagnosis not present

## 2017-03-15 DIAGNOSIS — J301 Allergic rhinitis due to pollen: Secondary | ICD-10-CM | POA: Diagnosis not present

## 2017-03-15 DIAGNOSIS — H1045 Other chronic allergic conjunctivitis: Secondary | ICD-10-CM | POA: Diagnosis not present

## 2017-03-19 DIAGNOSIS — J3081 Allergic rhinitis due to animal (cat) (dog) hair and dander: Secondary | ICD-10-CM | POA: Diagnosis not present

## 2017-03-19 DIAGNOSIS — J3089 Other allergic rhinitis: Secondary | ICD-10-CM | POA: Diagnosis not present

## 2017-03-19 DIAGNOSIS — J301 Allergic rhinitis due to pollen: Secondary | ICD-10-CM | POA: Diagnosis not present

## 2017-03-27 DIAGNOSIS — J3089 Other allergic rhinitis: Secondary | ICD-10-CM | POA: Diagnosis not present

## 2017-03-27 DIAGNOSIS — J3081 Allergic rhinitis due to animal (cat) (dog) hair and dander: Secondary | ICD-10-CM | POA: Diagnosis not present

## 2017-03-27 DIAGNOSIS — J301 Allergic rhinitis due to pollen: Secondary | ICD-10-CM | POA: Diagnosis not present

## 2017-04-02 DIAGNOSIS — J3089 Other allergic rhinitis: Secondary | ICD-10-CM | POA: Diagnosis not present

## 2017-04-02 DIAGNOSIS — J301 Allergic rhinitis due to pollen: Secondary | ICD-10-CM | POA: Diagnosis not present

## 2017-04-02 DIAGNOSIS — J3081 Allergic rhinitis due to animal (cat) (dog) hair and dander: Secondary | ICD-10-CM | POA: Diagnosis not present

## 2017-04-11 DIAGNOSIS — J301 Allergic rhinitis due to pollen: Secondary | ICD-10-CM | POA: Diagnosis not present

## 2017-04-11 DIAGNOSIS — J3089 Other allergic rhinitis: Secondary | ICD-10-CM | POA: Diagnosis not present

## 2017-04-11 DIAGNOSIS — J3081 Allergic rhinitis due to animal (cat) (dog) hair and dander: Secondary | ICD-10-CM | POA: Diagnosis not present

## 2017-04-13 DIAGNOSIS — J301 Allergic rhinitis due to pollen: Secondary | ICD-10-CM | POA: Diagnosis not present

## 2017-04-13 DIAGNOSIS — J3081 Allergic rhinitis due to animal (cat) (dog) hair and dander: Secondary | ICD-10-CM | POA: Diagnosis not present

## 2017-04-16 DIAGNOSIS — J3089 Other allergic rhinitis: Secondary | ICD-10-CM | POA: Diagnosis not present

## 2017-04-18 DIAGNOSIS — J3089 Other allergic rhinitis: Secondary | ICD-10-CM | POA: Diagnosis not present

## 2017-04-18 DIAGNOSIS — J3081 Allergic rhinitis due to animal (cat) (dog) hair and dander: Secondary | ICD-10-CM | POA: Diagnosis not present

## 2017-04-18 DIAGNOSIS — J301 Allergic rhinitis due to pollen: Secondary | ICD-10-CM | POA: Diagnosis not present

## 2017-04-25 DIAGNOSIS — J3081 Allergic rhinitis due to animal (cat) (dog) hair and dander: Secondary | ICD-10-CM | POA: Diagnosis not present

## 2017-04-25 DIAGNOSIS — J301 Allergic rhinitis due to pollen: Secondary | ICD-10-CM | POA: Diagnosis not present

## 2017-04-25 DIAGNOSIS — J3089 Other allergic rhinitis: Secondary | ICD-10-CM | POA: Diagnosis not present

## 2017-05-02 DIAGNOSIS — J3089 Other allergic rhinitis: Secondary | ICD-10-CM | POA: Diagnosis not present

## 2017-05-02 DIAGNOSIS — J3081 Allergic rhinitis due to animal (cat) (dog) hair and dander: Secondary | ICD-10-CM | POA: Diagnosis not present

## 2017-05-02 DIAGNOSIS — J301 Allergic rhinitis due to pollen: Secondary | ICD-10-CM | POA: Diagnosis not present

## 2017-05-09 DIAGNOSIS — J3081 Allergic rhinitis due to animal (cat) (dog) hair and dander: Secondary | ICD-10-CM | POA: Diagnosis not present

## 2017-05-09 DIAGNOSIS — J301 Allergic rhinitis due to pollen: Secondary | ICD-10-CM | POA: Diagnosis not present

## 2017-05-09 DIAGNOSIS — J3089 Other allergic rhinitis: Secondary | ICD-10-CM | POA: Diagnosis not present

## 2017-05-16 DIAGNOSIS — J3089 Other allergic rhinitis: Secondary | ICD-10-CM | POA: Diagnosis not present

## 2017-05-16 DIAGNOSIS — J3081 Allergic rhinitis due to animal (cat) (dog) hair and dander: Secondary | ICD-10-CM | POA: Diagnosis not present

## 2017-05-16 DIAGNOSIS — J301 Allergic rhinitis due to pollen: Secondary | ICD-10-CM | POA: Diagnosis not present

## 2017-05-23 DIAGNOSIS — J301 Allergic rhinitis due to pollen: Secondary | ICD-10-CM | POA: Diagnosis not present

## 2017-05-23 DIAGNOSIS — J3081 Allergic rhinitis due to animal (cat) (dog) hair and dander: Secondary | ICD-10-CM | POA: Diagnosis not present

## 2017-05-23 DIAGNOSIS — J3089 Other allergic rhinitis: Secondary | ICD-10-CM | POA: Diagnosis not present

## 2017-05-30 DIAGNOSIS — J3081 Allergic rhinitis due to animal (cat) (dog) hair and dander: Secondary | ICD-10-CM | POA: Diagnosis not present

## 2017-05-30 DIAGNOSIS — J3089 Other allergic rhinitis: Secondary | ICD-10-CM | POA: Diagnosis not present

## 2017-05-30 DIAGNOSIS — J301 Allergic rhinitis due to pollen: Secondary | ICD-10-CM | POA: Diagnosis not present

## 2017-06-06 DIAGNOSIS — J3081 Allergic rhinitis due to animal (cat) (dog) hair and dander: Secondary | ICD-10-CM | POA: Diagnosis not present

## 2017-06-06 DIAGNOSIS — J3089 Other allergic rhinitis: Secondary | ICD-10-CM | POA: Diagnosis not present

## 2017-06-06 DIAGNOSIS — J301 Allergic rhinitis due to pollen: Secondary | ICD-10-CM | POA: Diagnosis not present

## 2017-06-13 DIAGNOSIS — J3089 Other allergic rhinitis: Secondary | ICD-10-CM | POA: Diagnosis not present

## 2017-06-13 DIAGNOSIS — J301 Allergic rhinitis due to pollen: Secondary | ICD-10-CM | POA: Diagnosis not present

## 2017-06-13 DIAGNOSIS — J3081 Allergic rhinitis due to animal (cat) (dog) hair and dander: Secondary | ICD-10-CM | POA: Diagnosis not present

## 2017-06-20 DIAGNOSIS — J3081 Allergic rhinitis due to animal (cat) (dog) hair and dander: Secondary | ICD-10-CM | POA: Diagnosis not present

## 2017-06-20 DIAGNOSIS — J3089 Other allergic rhinitis: Secondary | ICD-10-CM | POA: Diagnosis not present

## 2017-06-20 DIAGNOSIS — J301 Allergic rhinitis due to pollen: Secondary | ICD-10-CM | POA: Diagnosis not present

## 2017-06-27 DIAGNOSIS — J301 Allergic rhinitis due to pollen: Secondary | ICD-10-CM | POA: Diagnosis not present

## 2017-06-27 DIAGNOSIS — J3089 Other allergic rhinitis: Secondary | ICD-10-CM | POA: Diagnosis not present

## 2017-06-27 DIAGNOSIS — J3081 Allergic rhinitis due to animal (cat) (dog) hair and dander: Secondary | ICD-10-CM | POA: Diagnosis not present

## 2017-07-04 DIAGNOSIS — J301 Allergic rhinitis due to pollen: Secondary | ICD-10-CM | POA: Diagnosis not present

## 2017-07-04 DIAGNOSIS — J3081 Allergic rhinitis due to animal (cat) (dog) hair and dander: Secondary | ICD-10-CM | POA: Diagnosis not present

## 2017-07-04 DIAGNOSIS — J3089 Other allergic rhinitis: Secondary | ICD-10-CM | POA: Diagnosis not present

## 2017-07-05 DIAGNOSIS — J302 Other seasonal allergic rhinitis: Secondary | ICD-10-CM | POA: Diagnosis not present

## 2017-07-05 DIAGNOSIS — I10 Essential (primary) hypertension: Secondary | ICD-10-CM | POA: Diagnosis not present

## 2017-07-05 DIAGNOSIS — E663 Overweight: Secondary | ICD-10-CM | POA: Diagnosis not present

## 2017-07-05 DIAGNOSIS — R42 Dizziness and giddiness: Secondary | ICD-10-CM | POA: Diagnosis not present

## 2017-07-11 DIAGNOSIS — J3089 Other allergic rhinitis: Secondary | ICD-10-CM | POA: Diagnosis not present

## 2017-07-11 DIAGNOSIS — J3081 Allergic rhinitis due to animal (cat) (dog) hair and dander: Secondary | ICD-10-CM | POA: Diagnosis not present

## 2017-07-11 DIAGNOSIS — J301 Allergic rhinitis due to pollen: Secondary | ICD-10-CM | POA: Diagnosis not present

## 2017-07-19 DIAGNOSIS — J301 Allergic rhinitis due to pollen: Secondary | ICD-10-CM | POA: Diagnosis not present

## 2017-07-19 DIAGNOSIS — J3089 Other allergic rhinitis: Secondary | ICD-10-CM | POA: Diagnosis not present

## 2017-07-19 DIAGNOSIS — J3081 Allergic rhinitis due to animal (cat) (dog) hair and dander: Secondary | ICD-10-CM | POA: Diagnosis not present

## 2017-07-25 DIAGNOSIS — J3081 Allergic rhinitis due to animal (cat) (dog) hair and dander: Secondary | ICD-10-CM | POA: Diagnosis not present

## 2017-07-25 DIAGNOSIS — J301 Allergic rhinitis due to pollen: Secondary | ICD-10-CM | POA: Diagnosis not present

## 2017-07-25 DIAGNOSIS — J3089 Other allergic rhinitis: Secondary | ICD-10-CM | POA: Diagnosis not present

## 2017-07-31 DIAGNOSIS — J3089 Other allergic rhinitis: Secondary | ICD-10-CM | POA: Diagnosis not present

## 2017-07-31 DIAGNOSIS — J301 Allergic rhinitis due to pollen: Secondary | ICD-10-CM | POA: Diagnosis not present

## 2017-07-31 DIAGNOSIS — J3081 Allergic rhinitis due to animal (cat) (dog) hair and dander: Secondary | ICD-10-CM | POA: Diagnosis not present

## 2017-08-08 DIAGNOSIS — J3089 Other allergic rhinitis: Secondary | ICD-10-CM | POA: Diagnosis not present

## 2017-08-08 DIAGNOSIS — J301 Allergic rhinitis due to pollen: Secondary | ICD-10-CM | POA: Diagnosis not present

## 2017-08-08 DIAGNOSIS — J3081 Allergic rhinitis due to animal (cat) (dog) hair and dander: Secondary | ICD-10-CM | POA: Diagnosis not present

## 2017-08-15 DIAGNOSIS — J3089 Other allergic rhinitis: Secondary | ICD-10-CM | POA: Diagnosis not present

## 2017-08-15 DIAGNOSIS — J3081 Allergic rhinitis due to animal (cat) (dog) hair and dander: Secondary | ICD-10-CM | POA: Diagnosis not present

## 2017-08-15 DIAGNOSIS — J301 Allergic rhinitis due to pollen: Secondary | ICD-10-CM | POA: Diagnosis not present

## 2017-08-22 DIAGNOSIS — J3089 Other allergic rhinitis: Secondary | ICD-10-CM | POA: Diagnosis not present

## 2017-08-22 DIAGNOSIS — J301 Allergic rhinitis due to pollen: Secondary | ICD-10-CM | POA: Diagnosis not present

## 2017-08-22 DIAGNOSIS — J3081 Allergic rhinitis due to animal (cat) (dog) hair and dander: Secondary | ICD-10-CM | POA: Diagnosis not present

## 2017-08-29 DIAGNOSIS — J301 Allergic rhinitis due to pollen: Secondary | ICD-10-CM | POA: Diagnosis not present

## 2017-08-29 DIAGNOSIS — J3081 Allergic rhinitis due to animal (cat) (dog) hair and dander: Secondary | ICD-10-CM | POA: Diagnosis not present

## 2017-08-29 DIAGNOSIS — J3089 Other allergic rhinitis: Secondary | ICD-10-CM | POA: Diagnosis not present

## 2017-09-05 DIAGNOSIS — J301 Allergic rhinitis due to pollen: Secondary | ICD-10-CM | POA: Diagnosis not present

## 2017-09-05 DIAGNOSIS — J3089 Other allergic rhinitis: Secondary | ICD-10-CM | POA: Diagnosis not present

## 2017-09-05 DIAGNOSIS — J3081 Allergic rhinitis due to animal (cat) (dog) hair and dander: Secondary | ICD-10-CM | POA: Diagnosis not present

## 2017-09-12 DIAGNOSIS — J3081 Allergic rhinitis due to animal (cat) (dog) hair and dander: Secondary | ICD-10-CM | POA: Diagnosis not present

## 2017-09-12 DIAGNOSIS — J301 Allergic rhinitis due to pollen: Secondary | ICD-10-CM | POA: Diagnosis not present

## 2017-09-12 DIAGNOSIS — J3089 Other allergic rhinitis: Secondary | ICD-10-CM | POA: Diagnosis not present

## 2017-09-19 DIAGNOSIS — J3081 Allergic rhinitis due to animal (cat) (dog) hair and dander: Secondary | ICD-10-CM | POA: Diagnosis not present

## 2017-09-19 DIAGNOSIS — J3089 Other allergic rhinitis: Secondary | ICD-10-CM | POA: Diagnosis not present

## 2017-09-19 DIAGNOSIS — J301 Allergic rhinitis due to pollen: Secondary | ICD-10-CM | POA: Diagnosis not present

## 2017-09-26 DIAGNOSIS — J301 Allergic rhinitis due to pollen: Secondary | ICD-10-CM | POA: Diagnosis not present

## 2017-09-26 DIAGNOSIS — J3081 Allergic rhinitis due to animal (cat) (dog) hair and dander: Secondary | ICD-10-CM | POA: Diagnosis not present

## 2017-09-26 DIAGNOSIS — J3089 Other allergic rhinitis: Secondary | ICD-10-CM | POA: Diagnosis not present

## 2017-10-03 DIAGNOSIS — J301 Allergic rhinitis due to pollen: Secondary | ICD-10-CM | POA: Diagnosis not present

## 2017-10-03 DIAGNOSIS — J3089 Other allergic rhinitis: Secondary | ICD-10-CM | POA: Diagnosis not present

## 2017-10-03 DIAGNOSIS — J3081 Allergic rhinitis due to animal (cat) (dog) hair and dander: Secondary | ICD-10-CM | POA: Diagnosis not present

## 2017-10-10 DIAGNOSIS — J3089 Other allergic rhinitis: Secondary | ICD-10-CM | POA: Diagnosis not present

## 2017-10-10 DIAGNOSIS — J301 Allergic rhinitis due to pollen: Secondary | ICD-10-CM | POA: Diagnosis not present

## 2017-10-10 DIAGNOSIS — J3081 Allergic rhinitis due to animal (cat) (dog) hair and dander: Secondary | ICD-10-CM | POA: Diagnosis not present

## 2017-10-17 DIAGNOSIS — J301 Allergic rhinitis due to pollen: Secondary | ICD-10-CM | POA: Diagnosis not present

## 2017-10-17 DIAGNOSIS — J3089 Other allergic rhinitis: Secondary | ICD-10-CM | POA: Diagnosis not present

## 2017-10-17 DIAGNOSIS — J3081 Allergic rhinitis due to animal (cat) (dog) hair and dander: Secondary | ICD-10-CM | POA: Diagnosis not present

## 2017-10-18 DIAGNOSIS — J3089 Other allergic rhinitis: Secondary | ICD-10-CM | POA: Diagnosis not present

## 2017-10-22 DIAGNOSIS — I10 Essential (primary) hypertension: Secondary | ICD-10-CM | POA: Diagnosis not present

## 2017-10-22 DIAGNOSIS — R000 Tachycardia, unspecified: Secondary | ICD-10-CM | POA: Diagnosis not present

## 2017-10-22 DIAGNOSIS — F419 Anxiety disorder, unspecified: Secondary | ICD-10-CM | POA: Diagnosis not present

## 2017-10-22 DIAGNOSIS — Z1389 Encounter for screening for other disorder: Secondary | ICD-10-CM | POA: Diagnosis not present

## 2017-10-24 DIAGNOSIS — J3089 Other allergic rhinitis: Secondary | ICD-10-CM | POA: Diagnosis not present

## 2017-10-24 DIAGNOSIS — J301 Allergic rhinitis due to pollen: Secondary | ICD-10-CM | POA: Diagnosis not present

## 2017-10-24 DIAGNOSIS — J3081 Allergic rhinitis due to animal (cat) (dog) hair and dander: Secondary | ICD-10-CM | POA: Diagnosis not present

## 2017-10-31 DIAGNOSIS — J3081 Allergic rhinitis due to animal (cat) (dog) hair and dander: Secondary | ICD-10-CM | POA: Diagnosis not present

## 2017-10-31 DIAGNOSIS — J301 Allergic rhinitis due to pollen: Secondary | ICD-10-CM | POA: Diagnosis not present

## 2017-10-31 DIAGNOSIS — J3089 Other allergic rhinitis: Secondary | ICD-10-CM | POA: Diagnosis not present

## 2017-11-07 DIAGNOSIS — J3089 Other allergic rhinitis: Secondary | ICD-10-CM | POA: Diagnosis not present

## 2017-11-07 DIAGNOSIS — J301 Allergic rhinitis due to pollen: Secondary | ICD-10-CM | POA: Diagnosis not present

## 2017-11-07 DIAGNOSIS — J3081 Allergic rhinitis due to animal (cat) (dog) hair and dander: Secondary | ICD-10-CM | POA: Diagnosis not present

## 2017-11-14 DIAGNOSIS — J301 Allergic rhinitis due to pollen: Secondary | ICD-10-CM | POA: Diagnosis not present

## 2017-11-14 DIAGNOSIS — J3081 Allergic rhinitis due to animal (cat) (dog) hair and dander: Secondary | ICD-10-CM | POA: Diagnosis not present

## 2017-11-14 DIAGNOSIS — J3089 Other allergic rhinitis: Secondary | ICD-10-CM | POA: Diagnosis not present

## 2017-11-21 DIAGNOSIS — J3089 Other allergic rhinitis: Secondary | ICD-10-CM | POA: Diagnosis not present

## 2017-11-21 DIAGNOSIS — J301 Allergic rhinitis due to pollen: Secondary | ICD-10-CM | POA: Diagnosis not present

## 2017-11-21 DIAGNOSIS — J3081 Allergic rhinitis due to animal (cat) (dog) hair and dander: Secondary | ICD-10-CM | POA: Diagnosis not present

## 2017-11-28 DIAGNOSIS — J3089 Other allergic rhinitis: Secondary | ICD-10-CM | POA: Diagnosis not present

## 2017-11-28 DIAGNOSIS — J3081 Allergic rhinitis due to animal (cat) (dog) hair and dander: Secondary | ICD-10-CM | POA: Diagnosis not present

## 2017-11-28 DIAGNOSIS — J301 Allergic rhinitis due to pollen: Secondary | ICD-10-CM | POA: Diagnosis not present

## 2017-12-05 DIAGNOSIS — J3081 Allergic rhinitis due to animal (cat) (dog) hair and dander: Secondary | ICD-10-CM | POA: Diagnosis not present

## 2017-12-05 DIAGNOSIS — J3089 Other allergic rhinitis: Secondary | ICD-10-CM | POA: Diagnosis not present

## 2017-12-05 DIAGNOSIS — J301 Allergic rhinitis due to pollen: Secondary | ICD-10-CM | POA: Diagnosis not present

## 2017-12-12 DIAGNOSIS — J301 Allergic rhinitis due to pollen: Secondary | ICD-10-CM | POA: Diagnosis not present

## 2017-12-12 DIAGNOSIS — J3081 Allergic rhinitis due to animal (cat) (dog) hair and dander: Secondary | ICD-10-CM | POA: Diagnosis not present

## 2017-12-12 DIAGNOSIS — J3089 Other allergic rhinitis: Secondary | ICD-10-CM | POA: Diagnosis not present

## 2017-12-19 DIAGNOSIS — J3081 Allergic rhinitis due to animal (cat) (dog) hair and dander: Secondary | ICD-10-CM | POA: Diagnosis not present

## 2017-12-19 DIAGNOSIS — J301 Allergic rhinitis due to pollen: Secondary | ICD-10-CM | POA: Diagnosis not present

## 2017-12-19 DIAGNOSIS — J3089 Other allergic rhinitis: Secondary | ICD-10-CM | POA: Diagnosis not present

## 2017-12-26 DIAGNOSIS — Z0000 Encounter for general adult medical examination without abnormal findings: Secondary | ICD-10-CM | POA: Diagnosis not present

## 2017-12-26 DIAGNOSIS — J3081 Allergic rhinitis due to animal (cat) (dog) hair and dander: Secondary | ICD-10-CM | POA: Diagnosis not present

## 2017-12-26 DIAGNOSIS — J3089 Other allergic rhinitis: Secondary | ICD-10-CM | POA: Diagnosis not present

## 2017-12-26 DIAGNOSIS — R82998 Other abnormal findings in urine: Secondary | ICD-10-CM | POA: Diagnosis not present

## 2017-12-26 DIAGNOSIS — J301 Allergic rhinitis due to pollen: Secondary | ICD-10-CM | POA: Diagnosis not present

## 2017-12-26 DIAGNOSIS — I10 Essential (primary) hypertension: Secondary | ICD-10-CM | POA: Diagnosis not present

## 2018-01-02 DIAGNOSIS — J3081 Allergic rhinitis due to animal (cat) (dog) hair and dander: Secondary | ICD-10-CM | POA: Diagnosis not present

## 2018-01-02 DIAGNOSIS — J301 Allergic rhinitis due to pollen: Secondary | ICD-10-CM | POA: Diagnosis not present

## 2018-01-02 DIAGNOSIS — F418 Other specified anxiety disorders: Secondary | ICD-10-CM | POA: Diagnosis not present

## 2018-01-02 DIAGNOSIS — Z1331 Encounter for screening for depression: Secondary | ICD-10-CM | POA: Diagnosis not present

## 2018-01-02 DIAGNOSIS — I10 Essential (primary) hypertension: Secondary | ICD-10-CM | POA: Diagnosis not present

## 2018-01-02 DIAGNOSIS — Z8679 Personal history of other diseases of the circulatory system: Secondary | ICD-10-CM | POA: Diagnosis not present

## 2018-01-02 DIAGNOSIS — J3089 Other allergic rhinitis: Secondary | ICD-10-CM | POA: Diagnosis not present

## 2018-01-02 DIAGNOSIS — E663 Overweight: Secondary | ICD-10-CM | POA: Diagnosis not present

## 2018-01-02 DIAGNOSIS — Z0000 Encounter for general adult medical examination without abnormal findings: Secondary | ICD-10-CM | POA: Diagnosis not present

## 2018-01-09 DIAGNOSIS — J3089 Other allergic rhinitis: Secondary | ICD-10-CM | POA: Diagnosis not present

## 2018-01-09 DIAGNOSIS — J3081 Allergic rhinitis due to animal (cat) (dog) hair and dander: Secondary | ICD-10-CM | POA: Diagnosis not present

## 2018-01-09 DIAGNOSIS — J301 Allergic rhinitis due to pollen: Secondary | ICD-10-CM | POA: Diagnosis not present

## 2018-01-16 DIAGNOSIS — J301 Allergic rhinitis due to pollen: Secondary | ICD-10-CM | POA: Diagnosis not present

## 2018-01-16 DIAGNOSIS — J3089 Other allergic rhinitis: Secondary | ICD-10-CM | POA: Diagnosis not present

## 2018-01-16 DIAGNOSIS — J3081 Allergic rhinitis due to animal (cat) (dog) hair and dander: Secondary | ICD-10-CM | POA: Diagnosis not present

## 2018-01-23 DIAGNOSIS — J3089 Other allergic rhinitis: Secondary | ICD-10-CM | POA: Diagnosis not present

## 2018-01-23 DIAGNOSIS — J301 Allergic rhinitis due to pollen: Secondary | ICD-10-CM | POA: Diagnosis not present

## 2018-01-23 DIAGNOSIS — J3081 Allergic rhinitis due to animal (cat) (dog) hair and dander: Secondary | ICD-10-CM | POA: Diagnosis not present

## 2018-01-30 DIAGNOSIS — J3081 Allergic rhinitis due to animal (cat) (dog) hair and dander: Secondary | ICD-10-CM | POA: Diagnosis not present

## 2018-01-30 DIAGNOSIS — J3089 Other allergic rhinitis: Secondary | ICD-10-CM | POA: Diagnosis not present

## 2018-01-30 DIAGNOSIS — J301 Allergic rhinitis due to pollen: Secondary | ICD-10-CM | POA: Diagnosis not present

## 2018-02-06 DIAGNOSIS — J3081 Allergic rhinitis due to animal (cat) (dog) hair and dander: Secondary | ICD-10-CM | POA: Diagnosis not present

## 2018-02-06 DIAGNOSIS — J3089 Other allergic rhinitis: Secondary | ICD-10-CM | POA: Diagnosis not present

## 2018-02-06 DIAGNOSIS — J301 Allergic rhinitis due to pollen: Secondary | ICD-10-CM | POA: Diagnosis not present

## 2018-02-19 DIAGNOSIS — J3089 Other allergic rhinitis: Secondary | ICD-10-CM | POA: Diagnosis not present

## 2018-02-19 DIAGNOSIS — J301 Allergic rhinitis due to pollen: Secondary | ICD-10-CM | POA: Diagnosis not present

## 2018-02-19 DIAGNOSIS — J3081 Allergic rhinitis due to animal (cat) (dog) hair and dander: Secondary | ICD-10-CM | POA: Diagnosis not present

## 2018-02-27 DIAGNOSIS — J301 Allergic rhinitis due to pollen: Secondary | ICD-10-CM | POA: Diagnosis not present

## 2018-02-27 DIAGNOSIS — J3089 Other allergic rhinitis: Secondary | ICD-10-CM | POA: Diagnosis not present

## 2018-02-27 DIAGNOSIS — J3081 Allergic rhinitis due to animal (cat) (dog) hair and dander: Secondary | ICD-10-CM | POA: Diagnosis not present

## 2018-03-05 DIAGNOSIS — J3081 Allergic rhinitis due to animal (cat) (dog) hair and dander: Secondary | ICD-10-CM | POA: Diagnosis not present

## 2018-03-05 DIAGNOSIS — J301 Allergic rhinitis due to pollen: Secondary | ICD-10-CM | POA: Diagnosis not present

## 2018-03-05 DIAGNOSIS — J3089 Other allergic rhinitis: Secondary | ICD-10-CM | POA: Diagnosis not present

## 2018-03-05 DIAGNOSIS — H1045 Other chronic allergic conjunctivitis: Secondary | ICD-10-CM | POA: Diagnosis not present

## 2018-03-13 DIAGNOSIS — J3089 Other allergic rhinitis: Secondary | ICD-10-CM | POA: Diagnosis not present

## 2018-03-13 DIAGNOSIS — J301 Allergic rhinitis due to pollen: Secondary | ICD-10-CM | POA: Diagnosis not present

## 2018-03-13 DIAGNOSIS — J3081 Allergic rhinitis due to animal (cat) (dog) hair and dander: Secondary | ICD-10-CM | POA: Diagnosis not present

## 2018-03-20 DIAGNOSIS — J301 Allergic rhinitis due to pollen: Secondary | ICD-10-CM | POA: Diagnosis not present

## 2018-03-20 DIAGNOSIS — J3081 Allergic rhinitis due to animal (cat) (dog) hair and dander: Secondary | ICD-10-CM | POA: Diagnosis not present

## 2018-03-20 DIAGNOSIS — J3089 Other allergic rhinitis: Secondary | ICD-10-CM | POA: Diagnosis not present

## 2018-03-26 DIAGNOSIS — J3081 Allergic rhinitis due to animal (cat) (dog) hair and dander: Secondary | ICD-10-CM | POA: Diagnosis not present

## 2018-03-26 DIAGNOSIS — J301 Allergic rhinitis due to pollen: Secondary | ICD-10-CM | POA: Diagnosis not present

## 2018-03-26 DIAGNOSIS — J3089 Other allergic rhinitis: Secondary | ICD-10-CM | POA: Diagnosis not present

## 2018-03-27 DIAGNOSIS — J3089 Other allergic rhinitis: Secondary | ICD-10-CM | POA: Diagnosis not present

## 2018-03-27 DIAGNOSIS — J3081 Allergic rhinitis due to animal (cat) (dog) hair and dander: Secondary | ICD-10-CM | POA: Diagnosis not present

## 2018-03-27 DIAGNOSIS — J301 Allergic rhinitis due to pollen: Secondary | ICD-10-CM | POA: Diagnosis not present

## 2018-04-03 DIAGNOSIS — J3081 Allergic rhinitis due to animal (cat) (dog) hair and dander: Secondary | ICD-10-CM | POA: Diagnosis not present

## 2018-04-03 DIAGNOSIS — J3089 Other allergic rhinitis: Secondary | ICD-10-CM | POA: Diagnosis not present

## 2018-04-03 DIAGNOSIS — J301 Allergic rhinitis due to pollen: Secondary | ICD-10-CM | POA: Diagnosis not present

## 2018-04-10 DIAGNOSIS — J301 Allergic rhinitis due to pollen: Secondary | ICD-10-CM | POA: Diagnosis not present

## 2018-04-10 DIAGNOSIS — J3081 Allergic rhinitis due to animal (cat) (dog) hair and dander: Secondary | ICD-10-CM | POA: Diagnosis not present

## 2018-04-10 DIAGNOSIS — J3089 Other allergic rhinitis: Secondary | ICD-10-CM | POA: Diagnosis not present

## 2018-04-17 DIAGNOSIS — J3081 Allergic rhinitis due to animal (cat) (dog) hair and dander: Secondary | ICD-10-CM | POA: Diagnosis not present

## 2018-04-17 DIAGNOSIS — J301 Allergic rhinitis due to pollen: Secondary | ICD-10-CM | POA: Diagnosis not present

## 2018-04-17 DIAGNOSIS — J3089 Other allergic rhinitis: Secondary | ICD-10-CM | POA: Diagnosis not present

## 2018-04-24 DIAGNOSIS — J3089 Other allergic rhinitis: Secondary | ICD-10-CM | POA: Diagnosis not present

## 2018-04-24 DIAGNOSIS — J3081 Allergic rhinitis due to animal (cat) (dog) hair and dander: Secondary | ICD-10-CM | POA: Diagnosis not present

## 2018-04-24 DIAGNOSIS — J301 Allergic rhinitis due to pollen: Secondary | ICD-10-CM | POA: Diagnosis not present

## 2018-05-01 DIAGNOSIS — J301 Allergic rhinitis due to pollen: Secondary | ICD-10-CM | POA: Diagnosis not present

## 2018-05-01 DIAGNOSIS — J3081 Allergic rhinitis due to animal (cat) (dog) hair and dander: Secondary | ICD-10-CM | POA: Diagnosis not present

## 2018-05-01 DIAGNOSIS — J3089 Other allergic rhinitis: Secondary | ICD-10-CM | POA: Diagnosis not present

## 2018-05-07 DIAGNOSIS — Z6825 Body mass index (BMI) 25.0-25.9, adult: Secondary | ICD-10-CM | POA: Diagnosis not present

## 2018-05-07 DIAGNOSIS — I10 Essential (primary) hypertension: Secondary | ICD-10-CM | POA: Diagnosis not present

## 2018-05-07 DIAGNOSIS — R42 Dizziness and giddiness: Secondary | ICD-10-CM | POA: Diagnosis not present

## 2018-05-07 DIAGNOSIS — H6593 Unspecified nonsuppurative otitis media, bilateral: Secondary | ICD-10-CM | POA: Diagnosis not present

## 2018-05-08 DIAGNOSIS — J301 Allergic rhinitis due to pollen: Secondary | ICD-10-CM | POA: Diagnosis not present

## 2018-05-08 DIAGNOSIS — J3089 Other allergic rhinitis: Secondary | ICD-10-CM | POA: Diagnosis not present

## 2018-05-08 DIAGNOSIS — J3081 Allergic rhinitis due to animal (cat) (dog) hair and dander: Secondary | ICD-10-CM | POA: Diagnosis not present

## 2018-05-15 DIAGNOSIS — J3089 Other allergic rhinitis: Secondary | ICD-10-CM | POA: Diagnosis not present

## 2018-05-15 DIAGNOSIS — J3081 Allergic rhinitis due to animal (cat) (dog) hair and dander: Secondary | ICD-10-CM | POA: Diagnosis not present

## 2018-05-15 DIAGNOSIS — J301 Allergic rhinitis due to pollen: Secondary | ICD-10-CM | POA: Diagnosis not present

## 2018-05-22 DIAGNOSIS — J301 Allergic rhinitis due to pollen: Secondary | ICD-10-CM | POA: Diagnosis not present

## 2018-05-22 DIAGNOSIS — J3089 Other allergic rhinitis: Secondary | ICD-10-CM | POA: Diagnosis not present

## 2018-05-22 DIAGNOSIS — J3081 Allergic rhinitis due to animal (cat) (dog) hair and dander: Secondary | ICD-10-CM | POA: Diagnosis not present

## 2018-05-23 DIAGNOSIS — J3089 Other allergic rhinitis: Secondary | ICD-10-CM | POA: Diagnosis not present

## 2018-05-29 DIAGNOSIS — J3089 Other allergic rhinitis: Secondary | ICD-10-CM | POA: Diagnosis not present

## 2018-05-29 DIAGNOSIS — J3081 Allergic rhinitis due to animal (cat) (dog) hair and dander: Secondary | ICD-10-CM | POA: Diagnosis not present

## 2018-05-29 DIAGNOSIS — J301 Allergic rhinitis due to pollen: Secondary | ICD-10-CM | POA: Diagnosis not present

## 2018-06-05 DIAGNOSIS — Z6825 Body mass index (BMI) 25.0-25.9, adult: Secondary | ICD-10-CM | POA: Diagnosis not present

## 2018-06-05 DIAGNOSIS — J3081 Allergic rhinitis due to animal (cat) (dog) hair and dander: Secondary | ICD-10-CM | POA: Diagnosis not present

## 2018-06-05 DIAGNOSIS — J3089 Other allergic rhinitis: Secondary | ICD-10-CM | POA: Diagnosis not present

## 2018-06-05 DIAGNOSIS — J301 Allergic rhinitis due to pollen: Secondary | ICD-10-CM | POA: Diagnosis not present

## 2018-06-05 DIAGNOSIS — I10 Essential (primary) hypertension: Secondary | ICD-10-CM | POA: Diagnosis not present

## 2018-06-06 DIAGNOSIS — Z6825 Body mass index (BMI) 25.0-25.9, adult: Secondary | ICD-10-CM | POA: Diagnosis not present

## 2018-06-06 DIAGNOSIS — I10 Essential (primary) hypertension: Secondary | ICD-10-CM | POA: Diagnosis not present

## 2018-06-12 DIAGNOSIS — J3081 Allergic rhinitis due to animal (cat) (dog) hair and dander: Secondary | ICD-10-CM | POA: Diagnosis not present

## 2018-06-12 DIAGNOSIS — J301 Allergic rhinitis due to pollen: Secondary | ICD-10-CM | POA: Diagnosis not present

## 2018-06-12 DIAGNOSIS — J3089 Other allergic rhinitis: Secondary | ICD-10-CM | POA: Diagnosis not present

## 2018-06-19 DIAGNOSIS — J3081 Allergic rhinitis due to animal (cat) (dog) hair and dander: Secondary | ICD-10-CM | POA: Diagnosis not present

## 2018-06-19 DIAGNOSIS — J3089 Other allergic rhinitis: Secondary | ICD-10-CM | POA: Diagnosis not present

## 2018-06-19 DIAGNOSIS — J301 Allergic rhinitis due to pollen: Secondary | ICD-10-CM | POA: Diagnosis not present

## 2018-06-26 DIAGNOSIS — J3089 Other allergic rhinitis: Secondary | ICD-10-CM | POA: Diagnosis not present

## 2018-06-26 DIAGNOSIS — J3081 Allergic rhinitis due to animal (cat) (dog) hair and dander: Secondary | ICD-10-CM | POA: Diagnosis not present

## 2018-06-26 DIAGNOSIS — J301 Allergic rhinitis due to pollen: Secondary | ICD-10-CM | POA: Diagnosis not present

## 2018-07-03 DIAGNOSIS — J3089 Other allergic rhinitis: Secondary | ICD-10-CM | POA: Diagnosis not present

## 2018-07-03 DIAGNOSIS — J301 Allergic rhinitis due to pollen: Secondary | ICD-10-CM | POA: Diagnosis not present

## 2018-07-03 DIAGNOSIS — J3081 Allergic rhinitis due to animal (cat) (dog) hair and dander: Secondary | ICD-10-CM | POA: Diagnosis not present

## 2018-07-10 DIAGNOSIS — J3081 Allergic rhinitis due to animal (cat) (dog) hair and dander: Secondary | ICD-10-CM | POA: Diagnosis not present

## 2018-07-10 DIAGNOSIS — I10 Essential (primary) hypertension: Secondary | ICD-10-CM | POA: Diagnosis not present

## 2018-07-10 DIAGNOSIS — J301 Allergic rhinitis due to pollen: Secondary | ICD-10-CM | POA: Diagnosis not present

## 2018-07-10 DIAGNOSIS — J3089 Other allergic rhinitis: Secondary | ICD-10-CM | POA: Diagnosis not present

## 2018-07-10 DIAGNOSIS — F900 Attention-deficit hyperactivity disorder, predominantly inattentive type: Secondary | ICD-10-CM | POA: Diagnosis not present

## 2018-07-10 DIAGNOSIS — J302 Other seasonal allergic rhinitis: Secondary | ICD-10-CM | POA: Diagnosis not present

## 2018-07-10 DIAGNOSIS — Z8679 Personal history of other diseases of the circulatory system: Secondary | ICD-10-CM | POA: Diagnosis not present

## 2018-07-15 DIAGNOSIS — J301 Allergic rhinitis due to pollen: Secondary | ICD-10-CM | POA: Diagnosis not present

## 2018-07-15 DIAGNOSIS — J3089 Other allergic rhinitis: Secondary | ICD-10-CM | POA: Diagnosis not present

## 2018-07-15 DIAGNOSIS — J3081 Allergic rhinitis due to animal (cat) (dog) hair and dander: Secondary | ICD-10-CM | POA: Diagnosis not present

## 2018-07-26 DIAGNOSIS — J3081 Allergic rhinitis due to animal (cat) (dog) hair and dander: Secondary | ICD-10-CM | POA: Diagnosis not present

## 2018-07-26 DIAGNOSIS — J3089 Other allergic rhinitis: Secondary | ICD-10-CM | POA: Diagnosis not present

## 2018-07-26 DIAGNOSIS — J301 Allergic rhinitis due to pollen: Secondary | ICD-10-CM | POA: Diagnosis not present

## 2018-07-31 DIAGNOSIS — J3089 Other allergic rhinitis: Secondary | ICD-10-CM | POA: Diagnosis not present

## 2018-07-31 DIAGNOSIS — J3081 Allergic rhinitis due to animal (cat) (dog) hair and dander: Secondary | ICD-10-CM | POA: Diagnosis not present

## 2018-07-31 DIAGNOSIS — J301 Allergic rhinitis due to pollen: Secondary | ICD-10-CM | POA: Diagnosis not present

## 2018-08-07 DIAGNOSIS — J3089 Other allergic rhinitis: Secondary | ICD-10-CM | POA: Diagnosis not present

## 2018-08-07 DIAGNOSIS — J301 Allergic rhinitis due to pollen: Secondary | ICD-10-CM | POA: Diagnosis not present

## 2018-08-07 DIAGNOSIS — J3081 Allergic rhinitis due to animal (cat) (dog) hair and dander: Secondary | ICD-10-CM | POA: Diagnosis not present

## 2018-08-14 DIAGNOSIS — J3089 Other allergic rhinitis: Secondary | ICD-10-CM | POA: Diagnosis not present

## 2018-08-14 DIAGNOSIS — J301 Allergic rhinitis due to pollen: Secondary | ICD-10-CM | POA: Diagnosis not present

## 2018-08-14 DIAGNOSIS — J3081 Allergic rhinitis due to animal (cat) (dog) hair and dander: Secondary | ICD-10-CM | POA: Diagnosis not present

## 2018-08-21 DIAGNOSIS — J301 Allergic rhinitis due to pollen: Secondary | ICD-10-CM | POA: Diagnosis not present

## 2018-08-21 DIAGNOSIS — J3089 Other allergic rhinitis: Secondary | ICD-10-CM | POA: Diagnosis not present

## 2018-08-21 DIAGNOSIS — J3081 Allergic rhinitis due to animal (cat) (dog) hair and dander: Secondary | ICD-10-CM | POA: Diagnosis not present

## 2018-08-28 DIAGNOSIS — J301 Allergic rhinitis due to pollen: Secondary | ICD-10-CM | POA: Diagnosis not present

## 2018-08-28 DIAGNOSIS — J3089 Other allergic rhinitis: Secondary | ICD-10-CM | POA: Diagnosis not present

## 2018-08-28 DIAGNOSIS — J3081 Allergic rhinitis due to animal (cat) (dog) hair and dander: Secondary | ICD-10-CM | POA: Diagnosis not present

## 2018-09-04 DIAGNOSIS — J3081 Allergic rhinitis due to animal (cat) (dog) hair and dander: Secondary | ICD-10-CM | POA: Diagnosis not present

## 2018-09-04 DIAGNOSIS — J3089 Other allergic rhinitis: Secondary | ICD-10-CM | POA: Diagnosis not present

## 2018-09-04 DIAGNOSIS — J301 Allergic rhinitis due to pollen: Secondary | ICD-10-CM | POA: Diagnosis not present

## 2018-09-11 DIAGNOSIS — J3081 Allergic rhinitis due to animal (cat) (dog) hair and dander: Secondary | ICD-10-CM | POA: Diagnosis not present

## 2018-09-11 DIAGNOSIS — J3089 Other allergic rhinitis: Secondary | ICD-10-CM | POA: Diagnosis not present

## 2018-09-11 DIAGNOSIS — J301 Allergic rhinitis due to pollen: Secondary | ICD-10-CM | POA: Diagnosis not present

## 2018-09-18 DIAGNOSIS — J301 Allergic rhinitis due to pollen: Secondary | ICD-10-CM | POA: Diagnosis not present

## 2018-09-18 DIAGNOSIS — J3081 Allergic rhinitis due to animal (cat) (dog) hair and dander: Secondary | ICD-10-CM | POA: Diagnosis not present

## 2018-09-18 DIAGNOSIS — J3089 Other allergic rhinitis: Secondary | ICD-10-CM | POA: Diagnosis not present

## 2018-09-25 DIAGNOSIS — J301 Allergic rhinitis due to pollen: Secondary | ICD-10-CM | POA: Diagnosis not present

## 2018-09-25 DIAGNOSIS — J3081 Allergic rhinitis due to animal (cat) (dog) hair and dander: Secondary | ICD-10-CM | POA: Diagnosis not present

## 2018-09-25 DIAGNOSIS — J3089 Other allergic rhinitis: Secondary | ICD-10-CM | POA: Diagnosis not present

## 2018-10-02 DIAGNOSIS — J301 Allergic rhinitis due to pollen: Secondary | ICD-10-CM | POA: Diagnosis not present

## 2018-10-02 DIAGNOSIS — J3089 Other allergic rhinitis: Secondary | ICD-10-CM | POA: Diagnosis not present

## 2018-10-02 DIAGNOSIS — J3081 Allergic rhinitis due to animal (cat) (dog) hair and dander: Secondary | ICD-10-CM | POA: Diagnosis not present

## 2018-10-09 DIAGNOSIS — J3081 Allergic rhinitis due to animal (cat) (dog) hair and dander: Secondary | ICD-10-CM | POA: Diagnosis not present

## 2018-10-09 DIAGNOSIS — J3089 Other allergic rhinitis: Secondary | ICD-10-CM | POA: Diagnosis not present

## 2018-10-09 DIAGNOSIS — J301 Allergic rhinitis due to pollen: Secondary | ICD-10-CM | POA: Diagnosis not present

## 2018-10-16 DIAGNOSIS — J3089 Other allergic rhinitis: Secondary | ICD-10-CM | POA: Diagnosis not present

## 2018-10-16 DIAGNOSIS — J3081 Allergic rhinitis due to animal (cat) (dog) hair and dander: Secondary | ICD-10-CM | POA: Diagnosis not present

## 2018-10-16 DIAGNOSIS — J301 Allergic rhinitis due to pollen: Secondary | ICD-10-CM | POA: Diagnosis not present

## 2018-10-22 DIAGNOSIS — J301 Allergic rhinitis due to pollen: Secondary | ICD-10-CM | POA: Diagnosis not present

## 2018-10-22 DIAGNOSIS — J3081 Allergic rhinitis due to animal (cat) (dog) hair and dander: Secondary | ICD-10-CM | POA: Diagnosis not present

## 2018-10-22 DIAGNOSIS — J3089 Other allergic rhinitis: Secondary | ICD-10-CM | POA: Diagnosis not present

## 2018-10-30 DIAGNOSIS — J3089 Other allergic rhinitis: Secondary | ICD-10-CM | POA: Diagnosis not present

## 2018-10-30 DIAGNOSIS — J3081 Allergic rhinitis due to animal (cat) (dog) hair and dander: Secondary | ICD-10-CM | POA: Diagnosis not present

## 2018-10-30 DIAGNOSIS — J301 Allergic rhinitis due to pollen: Secondary | ICD-10-CM | POA: Diagnosis not present

## 2018-10-31 DIAGNOSIS — J301 Allergic rhinitis due to pollen: Secondary | ICD-10-CM | POA: Diagnosis not present

## 2018-10-31 DIAGNOSIS — J3081 Allergic rhinitis due to animal (cat) (dog) hair and dander: Secondary | ICD-10-CM | POA: Diagnosis not present

## 2018-11-04 DIAGNOSIS — J3089 Other allergic rhinitis: Secondary | ICD-10-CM | POA: Diagnosis not present

## 2018-11-06 DIAGNOSIS — J3081 Allergic rhinitis due to animal (cat) (dog) hair and dander: Secondary | ICD-10-CM | POA: Diagnosis not present

## 2018-11-06 DIAGNOSIS — J301 Allergic rhinitis due to pollen: Secondary | ICD-10-CM | POA: Diagnosis not present

## 2018-11-06 DIAGNOSIS — J3089 Other allergic rhinitis: Secondary | ICD-10-CM | POA: Diagnosis not present

## 2018-11-12 DIAGNOSIS — J3081 Allergic rhinitis due to animal (cat) (dog) hair and dander: Secondary | ICD-10-CM | POA: Diagnosis not present

## 2018-11-12 DIAGNOSIS — J3089 Other allergic rhinitis: Secondary | ICD-10-CM | POA: Diagnosis not present

## 2018-11-12 DIAGNOSIS — J301 Allergic rhinitis due to pollen: Secondary | ICD-10-CM | POA: Diagnosis not present

## 2018-11-19 DIAGNOSIS — J3081 Allergic rhinitis due to animal (cat) (dog) hair and dander: Secondary | ICD-10-CM | POA: Diagnosis not present

## 2018-11-19 DIAGNOSIS — J3089 Other allergic rhinitis: Secondary | ICD-10-CM | POA: Diagnosis not present

## 2018-11-19 DIAGNOSIS — J301 Allergic rhinitis due to pollen: Secondary | ICD-10-CM | POA: Diagnosis not present

## 2018-11-22 DIAGNOSIS — R030 Elevated blood-pressure reading, without diagnosis of hypertension: Secondary | ICD-10-CM | POA: Diagnosis not present

## 2018-11-22 DIAGNOSIS — F419 Anxiety disorder, unspecified: Secondary | ICD-10-CM | POA: Diagnosis not present

## 2018-11-26 DIAGNOSIS — J3081 Allergic rhinitis due to animal (cat) (dog) hair and dander: Secondary | ICD-10-CM | POA: Diagnosis not present

## 2018-11-26 DIAGNOSIS — J301 Allergic rhinitis due to pollen: Secondary | ICD-10-CM | POA: Diagnosis not present

## 2018-11-26 DIAGNOSIS — J3089 Other allergic rhinitis: Secondary | ICD-10-CM | POA: Diagnosis not present

## 2018-12-03 DIAGNOSIS — J301 Allergic rhinitis due to pollen: Secondary | ICD-10-CM | POA: Diagnosis not present

## 2018-12-03 DIAGNOSIS — J3081 Allergic rhinitis due to animal (cat) (dog) hair and dander: Secondary | ICD-10-CM | POA: Diagnosis not present

## 2018-12-03 DIAGNOSIS — J3089 Other allergic rhinitis: Secondary | ICD-10-CM | POA: Diagnosis not present

## 2018-12-10 DIAGNOSIS — J301 Allergic rhinitis due to pollen: Secondary | ICD-10-CM | POA: Diagnosis not present

## 2018-12-10 DIAGNOSIS — J3089 Other allergic rhinitis: Secondary | ICD-10-CM | POA: Diagnosis not present

## 2018-12-10 DIAGNOSIS — J3081 Allergic rhinitis due to animal (cat) (dog) hair and dander: Secondary | ICD-10-CM | POA: Diagnosis not present

## 2018-12-17 DIAGNOSIS — J3081 Allergic rhinitis due to animal (cat) (dog) hair and dander: Secondary | ICD-10-CM | POA: Diagnosis not present

## 2018-12-17 DIAGNOSIS — J3089 Other allergic rhinitis: Secondary | ICD-10-CM | POA: Diagnosis not present

## 2018-12-17 DIAGNOSIS — J301 Allergic rhinitis due to pollen: Secondary | ICD-10-CM | POA: Diagnosis not present

## 2018-12-24 DIAGNOSIS — J3081 Allergic rhinitis due to animal (cat) (dog) hair and dander: Secondary | ICD-10-CM | POA: Diagnosis not present

## 2018-12-24 DIAGNOSIS — J301 Allergic rhinitis due to pollen: Secondary | ICD-10-CM | POA: Diagnosis not present

## 2018-12-24 DIAGNOSIS — J3089 Other allergic rhinitis: Secondary | ICD-10-CM | POA: Diagnosis not present

## 2018-12-31 DIAGNOSIS — J301 Allergic rhinitis due to pollen: Secondary | ICD-10-CM | POA: Diagnosis not present

## 2018-12-31 DIAGNOSIS — J3081 Allergic rhinitis due to animal (cat) (dog) hair and dander: Secondary | ICD-10-CM | POA: Diagnosis not present

## 2018-12-31 DIAGNOSIS — J3089 Other allergic rhinitis: Secondary | ICD-10-CM | POA: Diagnosis not present

## 2019-01-07 DIAGNOSIS — J3089 Other allergic rhinitis: Secondary | ICD-10-CM | POA: Diagnosis not present

## 2019-01-07 DIAGNOSIS — J301 Allergic rhinitis due to pollen: Secondary | ICD-10-CM | POA: Diagnosis not present

## 2019-01-07 DIAGNOSIS — J3081 Allergic rhinitis due to animal (cat) (dog) hair and dander: Secondary | ICD-10-CM | POA: Diagnosis not present

## 2019-01-10 ENCOUNTER — Emergency Department (HOSPITAL_COMMUNITY): Payer: BC Managed Care – PPO | Admitting: Anesthesiology

## 2019-01-10 ENCOUNTER — Encounter (HOSPITAL_COMMUNITY): Payer: Self-pay | Admitting: *Deleted

## 2019-01-10 ENCOUNTER — Observation Stay (HOSPITAL_COMMUNITY)
Admission: EM | Admit: 2019-01-10 | Discharge: 2019-01-11 | Disposition: A | Discharge status: Home / Self Care | Payer: BC Managed Care – PPO | Attending: Surgery | Admitting: Surgery

## 2019-01-10 ENCOUNTER — Other Ambulatory Visit: Payer: Self-pay

## 2019-01-10 ENCOUNTER — Emergency Department (HOSPITAL_COMMUNITY): Payer: BC Managed Care – PPO

## 2019-01-10 ENCOUNTER — Encounter (HOSPITAL_COMMUNITY)
Admission: EM | Disposition: A | Discharge status: Home / Self Care | Payer: Self-pay | Source: Home / Self Care | Attending: Emergency Medicine

## 2019-01-10 DIAGNOSIS — I10 Essential (primary) hypertension: Secondary | ICD-10-CM | POA: Insufficient documentation

## 2019-01-10 DIAGNOSIS — F900 Attention-deficit hyperactivity disorder, predominantly inattentive type: Secondary | ICD-10-CM | POA: Diagnosis not present

## 2019-01-10 DIAGNOSIS — Z1159 Encounter for screening for other viral diseases: Secondary | ICD-10-CM | POA: Insufficient documentation

## 2019-01-10 DIAGNOSIS — Z03818 Encounter for observation for suspected exposure to other biological agents ruled out: Secondary | ICD-10-CM | POA: Diagnosis not present

## 2019-01-10 DIAGNOSIS — K3580 Unspecified acute appendicitis: Secondary | ICD-10-CM | POA: Diagnosis not present

## 2019-01-10 DIAGNOSIS — K3533 Acute appendicitis with perforation and localized peritonitis, with abscess: Secondary | ICD-10-CM | POA: Diagnosis not present

## 2019-01-10 DIAGNOSIS — Z9049 Acquired absence of other specified parts of digestive tract: Secondary | ICD-10-CM

## 2019-01-10 DIAGNOSIS — R1031 Right lower quadrant pain: Secondary | ICD-10-CM | POA: Diagnosis not present

## 2019-01-10 DIAGNOSIS — J302 Other seasonal allergic rhinitis: Secondary | ICD-10-CM | POA: Diagnosis not present

## 2019-01-10 DIAGNOSIS — F17290 Nicotine dependence, other tobacco product, uncomplicated: Secondary | ICD-10-CM | POA: Diagnosis not present

## 2019-01-10 DIAGNOSIS — K5730 Diverticulosis of large intestine without perforation or abscess without bleeding: Secondary | ICD-10-CM | POA: Diagnosis not present

## 2019-01-10 HISTORY — PX: LAPAROSCOPIC APPENDECTOMY: SHX408

## 2019-01-10 LAB — CBC WITH DIFFERENTIAL/PLATELET
Abs Immature Granulocytes: 0.06 10*3/uL (ref 0.00–0.07)
Basophils Absolute: 0 10*3/uL (ref 0.0–0.1)
Basophils Relative: 0 %
Eosinophils Absolute: 0.1 10*3/uL (ref 0.0–0.5)
Eosinophils Relative: 0 %
HCT: 46.4 % (ref 39.0–52.0)
Hemoglobin: 15.2 g/dL (ref 13.0–17.0)
Immature Granulocytes: 0 %
Lymphocytes Relative: 11 %
Lymphs Abs: 1.6 10*3/uL (ref 0.7–4.0)
MCH: 28.7 pg (ref 26.0–34.0)
MCHC: 32.8 g/dL (ref 30.0–36.0)
MCV: 87.5 fL (ref 80.0–100.0)
Monocytes Absolute: 1.3 10*3/uL — ABNORMAL HIGH (ref 0.1–1.0)
Monocytes Relative: 9 %
Neutro Abs: 11 10*3/uL — ABNORMAL HIGH (ref 1.7–7.7)
Neutrophils Relative %: 80 %
Platelets: 322 10*3/uL (ref 150–400)
RBC: 5.3 MIL/uL (ref 4.22–5.81)
RDW: 12 % (ref 11.5–15.5)
WBC: 14 10*3/uL — ABNORMAL HIGH (ref 4.0–10.5)
nRBC: 0 % (ref 0.0–0.2)

## 2019-01-10 LAB — URINALYSIS, ROUTINE W REFLEX MICROSCOPIC
Bilirubin Urine: NEGATIVE
Glucose, UA: NEGATIVE mg/dL
Hgb urine dipstick: NEGATIVE
Ketones, ur: NEGATIVE mg/dL
Leukocytes,Ua: NEGATIVE
Nitrite: NEGATIVE
Protein, ur: NEGATIVE mg/dL
Specific Gravity, Urine: 1.009 (ref 1.005–1.030)
pH: 6 (ref 5.0–8.0)

## 2019-01-10 LAB — COMPREHENSIVE METABOLIC PANEL
ALT: 24 U/L (ref 0–44)
AST: 17 U/L (ref 15–41)
Albumin: 4.3 g/dL (ref 3.5–5.0)
Alkaline Phosphatase: 50 U/L (ref 38–126)
Anion gap: 8 (ref 5–15)
BUN: 9 mg/dL (ref 6–20)
CO2: 26 mmol/L (ref 22–32)
Calcium: 9.6 mg/dL (ref 8.9–10.3)
Chloride: 106 mmol/L (ref 98–111)
Creatinine, Ser: 0.94 mg/dL (ref 0.61–1.24)
GFR calc Af Amer: >60 mL/min (ref >60)
GFR calc non Af Amer: >60 mL/min (ref >60)
Glucose, Bld: 89 mg/dL (ref 70–99)
Potassium: 4.1 mmol/L (ref 3.5–5.1)
Sodium: 140 mmol/L (ref 135–145)
Total Bilirubin: 0.8 mg/dL (ref 0.3–1.2)
Total Protein: 7.4 g/dL (ref 6.5–8.1)

## 2019-01-10 LAB — SARS CORONAVIRUS 2 BY RT PCR (HOSPITAL ORDER, PERFORMED IN ~~LOC~~ HOSPITAL LAB): SARS Coronavirus 2: NEGATIVE

## 2019-01-10 LAB — LIPASE, BLOOD: Lipase: 29 U/L (ref 11–51)

## 2019-01-10 SURGERY — APPENDECTOMY, LAPAROSCOPIC
Anesthesia: General | Site: Abdomen

## 2019-01-10 MED ORDER — 0.9 % SODIUM CHLORIDE (POUR BTL) OPTIME
TOPICAL | Status: DC | PRN
  Administered 2019-01-10: 1000 mL

## 2019-01-10 MED ORDER — FENTANYL CITRATE (PF) 250 MCG/5ML IJ SOLN
INTRAMUSCULAR | Status: AC
  Filled 2019-01-10: qty 5

## 2019-01-10 MED ORDER — ROCURONIUM BROMIDE 100 MG/10ML IV SOLN
INTRAVENOUS | Status: DC | PRN
  Administered 2019-01-10: 40 mg via INTRAVENOUS

## 2019-01-10 MED ORDER — SODIUM CHLORIDE 0.9 % IR SOLN
Status: DC | PRN
  Administered 2019-01-10: 1000 mL

## 2019-01-10 MED ORDER — MIDAZOLAM HCL 2 MG/2ML IJ SOLN
INTRAMUSCULAR | Status: AC
  Filled 2019-01-10: qty 2

## 2019-01-10 MED ORDER — BUPIVACAINE HCL (PF) 0.25 % IJ SOLN
INTRAMUSCULAR | Status: AC
  Filled 2019-01-10: qty 30

## 2019-01-10 MED ORDER — BUPIVACAINE HCL (PF) 0.25 % IJ SOLN
INTRAMUSCULAR | Status: DC | PRN
  Administered 2019-01-10: 10 mL

## 2019-01-10 MED ORDER — PROPOFOL 10 MG/ML IV BOLUS
INTRAVENOUS | Status: AC
  Filled 2019-01-10: qty 40

## 2019-01-10 MED ORDER — PROPOFOL 10 MG/ML IV BOLUS
INTRAVENOUS | Status: DC | PRN
  Administered 2019-01-10: 180 mg via INTRAVENOUS

## 2019-01-10 MED ORDER — POTASSIUM CHLORIDE CRYS ER 20 MEQ PO TBCR: 40.0000 meq | EXTENDED_RELEASE_TABLET | Freq: Once | ORAL | Status: DC

## 2019-01-10 MED ORDER — MIDAZOLAM HCL 5 MG/5ML IJ SOLN
INTRAMUSCULAR | Status: DC | PRN
  Administered 2019-01-10: 2 mg via INTRAVENOUS

## 2019-01-10 MED ORDER — SODIUM CHLORIDE 0.9 % IV SOLN
2.0000 g | Freq: Once | INTRAVENOUS | Status: AC
  Administered 2019-01-10: 2 g via INTRAVENOUS
  Filled 2019-01-10: qty 20

## 2019-01-10 MED ORDER — SUCCINYLCHOLINE CHLORIDE 20 MG/ML IJ SOLN
INTRAMUSCULAR | Status: DC | PRN
  Administered 2019-01-10: 120 mg via INTRAVENOUS

## 2019-01-10 MED ORDER — LIDOCAINE HCL (CARDIAC) PF 100 MG/5ML IV SOSY
PREFILLED_SYRINGE | INTRAVENOUS | Status: DC | PRN
  Administered 2019-01-10: 60 mg via INTRATRACHEAL

## 2019-01-10 MED ORDER — FENTANYL CITRATE (PF) 250 MCG/5ML IJ SOLN
INTRAMUSCULAR | Status: DC | PRN
  Administered 2019-01-10: 50 ug via INTRAVENOUS
  Administered 2019-01-10: 100 ug via INTRAVENOUS
  Administered 2019-01-10: 50 ug via INTRAVENOUS
  Administered 2019-01-10: 100 ug via INTRAVENOUS
  Administered 2019-01-10 – 2019-01-11 (×2): 50 ug via INTRAVENOUS

## 2019-01-10 MED ORDER — IOHEXOL 300 MG/ML  SOLN
100.0000 mL | Freq: Once | INTRAMUSCULAR | Status: AC | PRN
  Administered 2019-01-10: 100 mL via INTRAVENOUS

## 2019-01-10 MED ORDER — LACTATED RINGERS IV SOLN
INTRAVENOUS | Status: DC | PRN
  Administered 2019-01-10 – 2019-01-11 (×2): via INTRAVENOUS

## 2019-01-10 SURGICAL SUPPLY — 40 items
APPLIER CLIP 5 13 M/L LIGAMAX5 (MISCELLANEOUS)
BLADE CLIPPER SURG (BLADE) IMPLANT
CANISTER SUCT 3000ML PPV (MISCELLANEOUS) ×2 IMPLANT
CHLORAPREP W/TINT 26ML (MISCELLANEOUS) ×2 IMPLANT
CLIP APPLIE 5 13 M/L LIGAMAX5 (MISCELLANEOUS) IMPLANT
COVER SURGICAL LIGHT HANDLE (MISCELLANEOUS) ×2 IMPLANT
COVER WAND RF STERILE (DRAPES) ×2 IMPLANT
CUTTER FLEX LINEAR 45M (STAPLE) ×2 IMPLANT
DERMABOND ADVANCED (GAUZE/BANDAGES/DRESSINGS) ×1
DERMABOND ADVANCED .7 DNX12 (GAUZE/BANDAGES/DRESSINGS) ×1 IMPLANT
ELECT REM PT RETURN 9FT ADLT (ELECTROSURGICAL) ×2
ELECTRODE REM PT RTRN 9FT ADLT (ELECTROSURGICAL) ×1 IMPLANT
GLOVE BIO SURGEON STRL SZ7.5 (GLOVE) ×2 IMPLANT
GLOVE INDICATOR 8.0 STRL GRN (GLOVE) ×2 IMPLANT
GOWN STRL REUS W/ TWL LRG LVL3 (GOWN DISPOSABLE) ×1 IMPLANT
GOWN STRL REUS W/ TWL XL LVL3 (GOWN DISPOSABLE) ×1 IMPLANT
GOWN STRL REUS W/TWL LRG LVL3 (GOWN DISPOSABLE) ×1
GOWN STRL REUS W/TWL XL LVL3 (GOWN DISPOSABLE) ×1
KIT BASIN OR (CUSTOM PROCEDURE TRAY) ×2 IMPLANT
KIT TURNOVER KIT B (KITS) ×2 IMPLANT
NS IRRIG 1000ML POUR BTL (IV SOLUTION) ×2 IMPLANT
PAD ARMBOARD 7.5X6 YLW CONV (MISCELLANEOUS) ×4 IMPLANT
PENCIL SMOKE EVACUATOR (MISCELLANEOUS) ×2 IMPLANT
POUCH SPECIMEN RETRIEVAL 10MM (ENDOMECHANICALS) ×2 IMPLANT
RELOAD 45 VASCULAR/THIN (ENDOMECHANICALS) IMPLANT
RELOAD STAPLE TA45 3.5 REG BLU (ENDOMECHANICALS) ×2 IMPLANT
SCISSORS LAP 5X35 DISP (ENDOMECHANICALS) ×2 IMPLANT
SET IRRIG TUBING LAPAROSCOPIC (IRRIGATION / IRRIGATOR) ×2 IMPLANT
SET TUBE SMOKE EVAC HIGH FLOW (TUBING) ×2 IMPLANT
SHEARS HARMONIC ACE PLUS 36CM (ENDOMECHANICALS) ×2 IMPLANT
SLEEVE ENDOPATH XCEL 5M (ENDOMECHANICALS) ×2 IMPLANT
SPECIMEN JAR SMALL (MISCELLANEOUS) ×2 IMPLANT
SUT MNCRL AB 4-0 PS2 18 (SUTURE) ×2 IMPLANT
TOWEL GREEN STERILE FF (TOWEL DISPOSABLE) ×2 IMPLANT
TOWEL OR 17X26 10 PK STRL BLUE (TOWEL DISPOSABLE) ×2 IMPLANT
TRAY FOLEY CATH 16FR SILVER (SET/KITS/TRAYS/PACK) ×2 IMPLANT
TRAY LAPAROSCOPIC MC (CUSTOM PROCEDURE TRAY) ×2 IMPLANT
TROCAR XCEL BLUNT TIP 100MML (ENDOMECHANICALS) ×2 IMPLANT
TROCAR XCEL NON-BLD 5MMX100MML (ENDOMECHANICALS) ×2 IMPLANT
WATER STERILE IRR 1000ML POUR (IV SOLUTION) ×2 IMPLANT

## 2019-01-10 NOTE — Anesthesia Preprocedure Evaluation (Addendum)
Anesthesia Evaluation  Patient identified by MRN, date of birth, ID band Patient awake    Reviewed: Allergy & Precautions, NPO status , Patient's Chart, lab work & pertinent test results  Airway Mallampati: II  TM Distance: >3 FB Neck ROM: Full    Dental  (+) Dental Advisory Given, Teeth Intact   Pulmonary Current Smoker,    breath sounds clear to auscultation       Cardiovascular negative cardio ROS   Rhythm:Regular Rate:Normal     Neuro/Psych    GI/Hepatic Neg liver ROS, History noted. CG   Endo/Other  negative endocrine ROS  Renal/GU negative Renal ROS     Musculoskeletal   Abdominal   Peds  Hematology   Anesthesia Other Findings   Reproductive/Obstetrics                            Anesthesia Physical Anesthesia Plan  ASA: I and emergent  Anesthesia Plan: General   Post-op Pain Management:    Induction: Intravenous  PONV Risk Score and Plan: Ondansetron, Dexamethasone and Midazolam  Airway Management Planned: Oral ETT  Additional Equipment:   Intra-op Plan:   Post-operative Plan: Extubation in OR  Informed Consent: I have reviewed the patients History and Physical, chart, labs and discussed the procedure including the risks, benefits and alternatives for the proposed anesthesia with the patient or authorized representative who has indicated his/her understanding and acceptance.     Dental advisory given  Plan Discussed with: CRNA, Anesthesiologist and Surgeon  Anesthesia Plan Comments:        Anesthesia Quick Evaluation

## 2019-01-10 NOTE — Anesthesia Procedure Notes (Signed)
Procedure Name: Intubation Date/Time: 01/10/2019 11:45 PM Performed by: Clovis Cao, CRNA Pre-anesthesia Checklist: Patient identified, Emergency Drugs available, Suction available, Patient being monitored and Timeout performed Patient Re-evaluated:Patient Re-evaluated prior to induction Oxygen Delivery Method: Circle system utilized Preoxygenation: Pre-oxygenation with 100% oxygen Induction Type: IV induction, Rapid sequence and Cricoid Pressure applied Laryngoscope Size: Miller and 2 Grade View: Grade I Tube type: Oral Tube size: 7.5 mm Number of attempts: 1 Airway Equipment and Method: Stylet Placement Confirmation: ETT inserted through vocal cords under direct vision,  positive ETCO2 and breath sounds checked- equal and bilateral Secured at: 22 cm Tube secured with: Tape Dental Injury: Teeth and Oropharynx as per pre-operative assessment

## 2019-01-10 NOTE — ED Provider Notes (Signed)
[facility]   CSN: [id number] Arrival date & time: 01/10/19  1618    History   Chief Complaint Chief Complaint  Patient presents with  . Abdominal Pain    HPI Nicholas Frederick is a 33 y.o. male patient presenting today for evaluation of acute onset, intermittent abdominal pain sent from his PCP for rule out of appendicitis.  He reports that he awoke today at around 11 AM or 12 PM with severe periumbilical abdominal pain.  He reports he was able to fall back asleep but when he awoke the pain was still present and began to radiate to the right lower quadrant of the abdomen.  He reports that since then the pain has become less severe and is now more intermittent than constant.  It worsens with certain movements and with palpation.  He took an Advil prior to going to see his PCP with some relief.  He notes some malaise but denies fevers or chills.  No nausea or vomiting.  No urinary symptoms, diarrhea, or constipation.  No testicular pain or scrotal swelling.  His last bowel movement was yesterday.  He was seen by his PCP and sent to the ED for further evaluation     The history is provided by the patient.    History reviewed. No pertinent past medical history.  There are no active problems to display for this patient.   History reviewed. No pertinent surgical history.      Home Medications    Prior to Admission medications   Not on File    Family History No family history on file.  Social History Social History   Tobacco Use  . Smoking status: Current Some Day Smoker    Types: Cigars  . Smokeless tobacco: Never Used  Substance Use Topics  . Alcohol use: Yes    Comment: occ  . Drug use: Not on file     Allergies   Patient has no known allergies.   Review of Systems Review of Systems  Constitutional: Negative for chills and fever.  Respiratory: Negative for shortness of breath.   Cardiovascular: Negative  for chest pain.  Gastrointestinal: Positive for abdominal pain. Negative for constipation, diarrhea, nausea and vomiting.  Genitourinary: Negative for dysuria, frequency, hematuria and urgency.  All other systems reviewed and are negative.    Physical Exam Updated Vital Signs BP (!) 146/85 (BP Location: Right Arm)   Pulse (!) 106   Temp 98.7 F (37.1 C) (Oral)   Resp 16   Ht 5\' 10"  (1.778 m)   Wt 86.2 kg   SpO2 100%   BMI 27.26 kg/m   Physical Exam Vitals signs and nursing note reviewed.  Constitutional:      General: He is not in acute distress.    Appearance: He is well-developed.  HENT:     Head: Normocephalic and atraumatic.  Eyes:     General:        Right eye: No discharge.        Left eye: No discharge.     Conjunctiva/sclera: Conjunctivae normal.  Neck:     Vascular: No JVD.     Trachea: No tracheal deviation.  Cardiovascular:     Rate and Rhythm: Tachycardia present.  Pulmonary:     Effort: Pulmonary effort is normal.  Abdominal:     General: Abdomen is flat. There is no distension.     Palpations: Abdomen is soft.     Tenderness: There  is abdominal tenderness in the right upper quadrant, right lower quadrant, periumbilical area and suprapubic area. There is guarding. There is no right CVA tenderness, left CVA tenderness or rebound. Positive signs include Rovsing's sign, McBurney's sign and obturator sign. Negative signs include Murphy's sign.     Comments: Maximally tender to palpation in the right lower quadrant  Skin:    General: Skin is warm.     Findings: No erythema.  Neurological:     Mental Status: He is alert.  Psychiatric:        Behavior: Behavior normal.      ED Treatments / Results  Labs (all labs ordered are listed, but only abnormal results are displayed) Labs Reviewed  URINALYSIS, ROUTINE W REFLEX MICROSCOPIC - Abnormal; Notable for the following components:      Result Value   Color, Urine STRAW (*)    All other components within  normal limits  CBC WITH DIFFERENTIAL/PLATELET - Abnormal; Notable for the following components:   WBC 14.0 (*)    Neutro Abs 11.0 (*)    Monocytes Absolute 1.3 (*)    All other components within normal limits  SARS CORONAVIRUS 2 (HOSPITAL ORDER, PERFORMED IN [facility])  COMPREHENSIVE METABOLIC PANEL  LIPASE, BLOOD    EKG None  Radiology Ct Abdomen Pelvis W Contrast  Result Date: 01/10/2019 CLINICAL DATA:  Acute onset abdominal pain today. Elevated white blood cell count. EXAM: CT ABDOMEN AND PELVIS WITH CONTRAST TECHNIQUE: Multidetector CT imaging of the abdomen and pelvis was performed using the standard protocol following bolus administration of intravenous contrast. CONTRAST:  100 mL OMNIPAQUE IOHEXOL 300 MG/ML  SOLN COMPARISON:  None. FINDINGS: Lower chest: Lung bases clear. Heart size normal. No pleural or pericardial effusion. Hepatobiliary: No focal liver abnormality is seen. No gallstones, gallbladder wall thickening, or biliary dilatation. Pancreas: Unremarkable. No pancreatic ductal dilatation or surrounding inflammatory changes. Spleen: Normal in size without focal abnormality. Adrenals/Urinary Tract: Adrenal glands are unremarkable. Kidneys are normal, without renal calculi, focal lesion, or hydronephrosis. Bladder is unremarkable. Stomach/Bowel: The appendix is dilated with periappendiceal inflammatory change consistent with acute appendicitis. No abscess or perforation. A few sigmoid diverticula are noted but there is no diverticulitis. The colon otherwise appears normal. Stomach and small bowel are normal in appearance. Vascular/Lymphatic: No significant vascular findings are present. No enlarged abdominal or pelvic lymph nodes. Reproductive: Prostate is unremarkable. Other: None. Musculoskeletal: Negative. IMPRESSION: Acute appendicitis without abscess or perforation. Mild sigmoid diverticulosis without diverticulitis. These results were called by telephone at the time  of interpretation on 01/10/2019 at 8:10 pm to Jacalyn LefevreJULIE HAVILAND, M.D. Who verbally acknowledged these results. Electronically Signed   By: Drusilla Kannerhomas  Dalessio M.D.   On: 01/10/2019 20:14    Procedures Procedures (including critical care time)  Medications Ordered in ED Medications  cefTRIAXone (ROCEPHIN) 2 g in sodium chloride 0.9 % 100 mL IVPB (2 g Intravenous New Bag/Given 01/10/19 2057)  iohexol (OMNIPAQUE) 300 MG/ML solution 100 mL (100 mLs Intravenous Contrast Given 01/10/19 1954)     Initial Impression / Assessment and Plan / ED Course  I have reviewed the triage vital signs and the nursing notes.  Pertinent labs & imaging results that were available during my care of the patient were reviewed by me and considered in my medical decision making (see chart for details).        Patient presenting for evaluation of right lower quadrant abdominal pain.  Sent from PCP for further evaluation.  He is afebrile, mildly  tachycardic and hypertensive on initial presentation.  He is nontoxic in appearance.  He does have some localized guarding on palpation of the right lower quadrant of the abdomen.  Will obtain blood work and CT for further evaluation.  I offered pain medication but he declined stating that he overall feels well.  Lab work reviewed by me shows a leukocytosis with elevation in absolute neutrophils, no anemia, no metabolic derangements, no renal insufficiency.  UA does not suggest UTI or nephrolithiasis.  His CT scan shows evidence of acute appendicitis without abscess or perforation.  He does have mild slight sigmoid diverticulosis without evidence of diverticulitis.  I spoke with Dr. Dema Severin with general surgery in consultation who will see the patient emergently in the ED.  He will be given Rocephin in the ED preoperatively.  COVID swab is pending.  Final Clinical Impressions(s) / ED Diagnoses   Final diagnoses:  Appendicitis, acute, with peritonitis    ED Discharge Orders    None        Debroah Baller 01/10/19 2100    Isla Pence, MD 01/10/19 2246

## 2019-01-10 NOTE — ED Triage Notes (Signed)
Pt here POV, sent by pcp for r/o appy.  Acute onset RLQ pain since this afternoon at 2 pm.  Labs drawn at pcp's.

## 2019-01-10 NOTE — H&P (Signed)
CC: Consult by ED-P Michela PitcherMina Fawze for acute appendicitis  HPI: Leilani Merlndrew H Leaf is an 33 y.o. male with prior hx of HTN (now off medication) whom presented to the emergency room today with complaints of onset of periumbilical abdominal pain around 11 AM today.  He reports having some right-sided back pain last night but nothing else.  Throughout the day and afternoon the pain subsequently localized to his right lower quadrant.  He denies any fever/chills/nausea/vomiting.  He reports normal bowel movements with his last bowel movement yesterday.  He denies ever having had this pain before.  He did take some Advil earlier today and noted some mild improvement in his pain.  PMH: Prior history of hypertension on medication but this was discontinued as he states his blood pressure was actually low  PSH: Wisdom teeth; denies any other surgical procedures in the past  Social: 1 cigar/month; social EtOH use; denies drug use.  He works with [employer], [employer] for [employer].  He has a 6928-month-old child at home and his wife is currently due with their next child in approximately 1.5 months  History reviewed. No pertinent past medical history.  History reviewed. No pertinent surgical history.  No family history on file.  Social:  reports that he has been smoking cigars. He has never used smokeless tobacco. He reports current alcohol use. No history on file for drug.  Allergies: No Known Allergies  Medications: I have reviewed the patient's current medications.  Results for orders placed or performed during the hospital encounter of 01/10/19 (from the past 48 hour(s))  Urinalysis, Routine w reflex microscopic     Status: Abnormal   Collection Time: 01/10/19  6:29 PM  Result Value Ref Range   Color, Urine STRAW (A) YELLOW   APPearance CLEAR CLEAR   Specific Gravity, Urine 1.009 1.005 - 1.030   pH 6.0 5.0 - 8.0   Glucose, UA NEGATIVE NEGATIVE mg/dL   Hgb urine dipstick NEGATIVE NEGATIVE   Bilirubin  Urine NEGATIVE NEGATIVE   Ketones, ur NEGATIVE NEGATIVE mg/dL   Protein, ur NEGATIVE NEGATIVE mg/dL   Nitrite NEGATIVE NEGATIVE   Leukocytes,Ua NEGATIVE NEGATIVE    Comment: Performed at [facility], [address] [place], [place] [address]  Comprehensive metabolic panel     Status: None   Collection Time: 01/10/19  6:48 PM  Result Value Ref Range   Sodium 140 135 - 145 mmol/L   Potassium 4.1 3.5 - 5.1 mmol/L   Chloride 106 98 - 111 mmol/L   CO2 26 22 - 32 mmol/L   Glucose, Bld 89 70 - 99 mg/dL   BUN 9 6 - 20 mg/dL   Creatinine, Ser 6.040.94 0.61 - 1.24 mg/dL   Calcium 9.6 8.9 - 54.010.3 mg/dL   Total Protein 7.4 6.5 - 8.1 g/dL   Albumin 4.3 3.5 - 5.0 g/dL   AST 17 15 - 41 U/L   ALT 24 0 - 44 U/L   Alkaline Phosphatase 50 38 - 126 U/L   Total Bilirubin 0.8 0.3 - 1.2 mg/dL   GFR calc non Af Amer >60 >60 mL/min   GFR calc Af Amer >60 >60 mL/min   Anion gap 8 5 - 15    Comment: Performed at [facility], [address] [place], [place] [address]  CBC with Differential     Status: Abnormal   Collection Time: 01/10/19  6:48 PM  Result Value Ref Range   WBC 14.0 (H) 4.0 - 10.5 K/uL  RBC 5.30 4.22 - 5.81 MIL/uL   Hemoglobin 15.2 13.0 - 17.0 g/dL   HCT 19.146.4 47.839.0 - 29.552.0 %   MCV 87.5 80.0 - 100.0 fL   MCH 28.7 26.0 - 34.0 pg   MCHC 32.8 30.0 - 36.0 g/dL   RDW 62.112.0 30.811.5 - 65.715.5 %   Platelets 322 150 - 400 K/uL   nRBC 0.0 0.0 - 0.2 %   Neutrophils Relative % 80 %   Neutro Abs 11.0 (H) 1.7 - 7.7 K/uL   Lymphocytes Relative 11 %   Lymphs Abs 1.6 0.7 - 4.0 K/uL   Monocytes Relative 9 %   Monocytes Absolute 1.3 (H) 0.1 - 1.0 K/uL   Eosinophils Relative 0 %   Eosinophils Absolute 0.1 0.0 - 0.5 K/uL   Basophils Relative 0 %   Basophils Absolute 0.0 0.0 - 0.1 K/uL   Immature Granulocytes 0 %   Abs Immature Granulocytes 0.06 0.00 - 0.07 K/uL    Comment: Performed at [facility], [address] [place], [place] [address]  Lipase, blood     Status: None   Collection  Time: 01/10/19  6:48 PM  Result Value Ref Range   Lipase 29 11 - 51 U/L    Comment: Performed at [facility], [address] [place], [place] [address]    Ct Abdomen Pelvis W Contrast  Result Date: 01/10/2019 CLINICAL DATA:  Acute onset abdominal pain today. Elevated Fronia Depass blood cell count. EXAM: CT ABDOMEN AND PELVIS WITH CONTRAST TECHNIQUE: Multidetector CT imaging of the abdomen and pelvis was performed using the standard protocol following bolus administration of intravenous contrast. CONTRAST:  100 mL OMNIPAQUE IOHEXOL 300 MG/ML  SOLN COMPARISON:  None. FINDINGS: Lower chest: Lung bases clear. Heart size normal. No pleural or pericardial effusion. Hepatobiliary: No focal liver abnormality is seen. No gallstones, gallbladder wall thickening, or biliary dilatation. Pancreas: Unremarkable. No pancreatic ductal dilatation or surrounding inflammatory changes. Spleen: Normal in size without focal abnormality. Adrenals/Urinary Tract: Adrenal glands are unremarkable. Kidneys are normal, without renal calculi, focal lesion, or hydronephrosis. Bladder is unremarkable. Stomach/Bowel: The appendix is dilated with periappendiceal inflammatory change consistent with acute appendicitis. No abscess or perforation. A few sigmoid diverticula are noted but there is no diverticulitis. The colon otherwise appears normal. Stomach and small bowel are normal in appearance. Vascular/Lymphatic: No significant vascular findings are present. No enlarged abdominal or pelvic lymph nodes. Reproductive: Prostate is unremarkable. Other: None. Musculoskeletal: Negative. IMPRESSION: Acute appendicitis without abscess or perforation. Mild sigmoid diverticulosis without diverticulitis. These results were called by telephone at the time of interpretation on 01/10/2019 at 8:10 pm to Jacalyn LefevreJULIE HAVILAND, M.D. Who verbally acknowledged these results. Electronically Signed   By: Drusilla Kannerhomas  Dalessio M.D.   On: 01/10/2019 20:14    ROS - all of  the below systems have been reviewed with the patient and positives are indicated with bold text General: chills, fever or night sweats Eyes: blurry vision or double vision ENT: epistaxis or sore throat Allergy/Immunology: itchy/watery eyes or nasal congestion Hematologic/Lymphatic: bleeding problems, blood clots or swollen lymph nodes Endocrine: temperature intolerance or unexpected weight changes Breast: new or changing breast lumps or nipple discharge Resp: cough, shortness of breath, or wheezing CV: chest pain or dyspnea on exertion GI: as per HPI GU: dysuria, trouble voiding, or hematuria MSK: joint pain or joint stiffness Neuro: TIA or stroke symptoms Derm: pruritus and skin lesion changes Psych: anxiety and depression  PE Blood pressure (!) 146/85, pulse (!) 106,  temperature 98.7 F (37.1 C), temperature source Oral, resp. rate 16, height 5\' 10"  (1.778 m), weight 86.2 kg, SpO2 100 %. Constitutional: NAD; conversant; no deformities Eyes: Wearing glasses; moist conjunctiva; no lid lag; anicteric; PERRL Neck: Trachea midline; no thyromegaly Lungs: Normal respiratory effort; no tactile fremitus CV: RRR; no palpable thrills; no pitting edema GI: Abd soft, focally ttp in RLQ; no tenderness elsewhere; no rebound; no guarding; no palpable hepatosplenomegaly MSK: Normal gait; no clubbing/cyanosis Psychiatric: Appropriate affect; alert and oriented x3 Lymphatic: No palpable cervical or axillary lymphadenopathy  Results for orders placed or performed during the hospital encounter of 01/10/19 (from the past 48 hour(s))  Urinalysis, Routine w reflex microscopic     Status: Abnormal   Collection Time: 01/10/19  6:29 PM  Result Value Ref Range   Color, Urine STRAW (A) YELLOW   APPearance CLEAR CLEAR   Specific Gravity, Urine 1.009 1.005 - 1.030   pH 6.0 5.0 - 8.0   Glucose, UA NEGATIVE NEGATIVE mg/dL   Hgb urine dipstick NEGATIVE NEGATIVE   Bilirubin Urine NEGATIVE NEGATIVE    Ketones, ur NEGATIVE NEGATIVE mg/dL   Protein, ur NEGATIVE NEGATIVE mg/dL   Nitrite NEGATIVE NEGATIVE   Leukocytes,Ua NEGATIVE NEGATIVE    Comment: Performed at [facility], [address] [place], [place] [address]  Comprehensive metabolic panel     Status: None   Collection Time: 01/10/19  6:48 PM  Result Value Ref Range   Sodium 140 135 - 145 mmol/L   Potassium 4.1 3.5 - 5.1 mmol/L   Chloride 106 98 - 111 mmol/L   CO2 26 22 - 32 mmol/L   Glucose, Bld 89 70 - 99 mg/dL   BUN 9 6 - 20 mg/dL   Creatinine, Ser 0.94 0.61 - 1.24 mg/dL   Calcium 9.6 8.9 - 10.3 mg/dL   Total Protein 7.4 6.5 - 8.1 g/dL   Albumin 4.3 3.5 - 5.0 g/dL   AST 17 15 - 41 U/L   ALT 24 0 - 44 U/L   Alkaline Phosphatase 50 38 - 126 U/L   Total Bilirubin 0.8 0.3 - 1.2 mg/dL   GFR calc non Af Amer >60 >60 mL/min   GFR calc Af Amer >60 >60 mL/min   Anion gap 8 5 - 15    Comment: Performed at [facility] [address] [place], [place] [address]  CBC with Differential     Status: Abnormal   Collection Time: 01/10/19  6:48 PM  Result Value Ref Range   WBC 14.0 (H) 4.0 - 10.5 K/uL   RBC 5.30 4.22 - 5.81 MIL/uL   Hemoglobin 15.2 13.0 - 17.0 g/dL   HCT 46.4 39.0 - 52.0 %   MCV 87.5 80.0 - 100.0 fL   MCH 28.7 26.0 - 34.0 pg   MCHC 32.8 30.0 - 36.0 g/dL   RDW 12.0 11.5 - 15.5 %   Platelets 322 150 - 400 K/uL   nRBC 0.0 0.0 - 0.2 %   Neutrophils Relative % 80 %   Neutro Abs 11.0 (H) 1.7 - 7.7 K/uL   Lymphocytes Relative 11 %   Lymphs Abs 1.6 0.7 - 4.0 K/uL   Monocytes Relative 9 %   Monocytes Absolute 1.3 (H) 0.1 - 1.0 K/uL   Eosinophils Relative 0 %   Eosinophils Absolute 0.1 0.0 - 0.5 K/uL   Basophils Relative 0 %   Basophils Absolute 0.0 0.0 - 0.1 K/uL   Immature Granulocytes 0 %  Abs Immature Granulocytes 0.06 0.00 - 0.07 K/uL    Comment: Performed at [facility], [address] [place], [place] [address]  Lipase, blood     Status: None   Collection Time: 01/10/19  6:48 PM   Result Value Ref Range   Lipase 29 11 - 51 U/L    Comment: Performed at [facility], [address] [place], [place] [address]    Ct Abdomen Pelvis W Contrast  Result Date: 01/10/2019 CLINICAL DATA:  Acute onset abdominal pain today. Elevated Sherrye Puga blood cell count. EXAM: CT ABDOMEN AND PELVIS WITH CONTRAST TECHNIQUE: Multidetector CT imaging of the abdomen and pelvis was performed using the standard protocol following bolus administration of intravenous contrast. CONTRAST:  100 mL OMNIPAQUE IOHEXOL 300 MG/ML  SOLN COMPARISON:  None. FINDINGS: Lower chest: Lung bases clear. Heart size normal. No pleural or pericardial effusion. Hepatobiliary: No focal liver abnormality is seen. No gallstones, gallbladder wall thickening, or biliary dilatation. Pancreas: Unremarkable. No pancreatic ductal dilatation or surrounding inflammatory changes. Spleen: Normal in size without focal abnormality. Adrenals/Urinary Tract: Adrenal glands are unremarkable. Kidneys are normal, without renal calculi, focal lesion, or hydronephrosis. Bladder is unremarkable. Stomach/Bowel: The appendix is dilated with periappendiceal inflammatory change consistent with acute appendicitis. No abscess or perforation. A few sigmoid diverticula are noted but there is no diverticulitis. The colon otherwise appears normal. Stomach and small bowel are normal in appearance. Vascular/Lymphatic: No significant vascular findings are present. No enlarged abdominal or pelvic lymph nodes. Reproductive: Prostate is unremarkable. Other: None. Musculoskeletal: Negative. IMPRESSION: Acute appendicitis without abscess or perforation. Mild sigmoid diverticulosis without diverticulitis. These results were called by telephone at the time of interpretation on 01/10/2019 at 8:10 pm to Jacalyn LefevreJULIE HAVILAND, M.D. Who verbally acknowledged these results. Electronically Signed   By: Drusilla Kannerhomas  Dalessio M.D.   On: 01/10/2019 20:14   A/P: Leilani Merlndrew H Proffit is an 33 y.o. male  with acute appendicitis; no evidence of perforation/abscess on CT  WBC 14  -The anatomy and physiology of the GI tract was discussed at length with the patient. The pathophysiology of appendicitis was discussed at length as well. -I discussed options moving forward for treatment including observation with IV abx vs surgery. We discussed that with antibiotics alone, there is reasonable success in managing appendicitis without needing an operation. We discussed risks of recurrence at 6150yrs being as high as 40% in some studies (APPAC trial in cases of acute uncomplicated appendicitis - exclusions being perforation, abscess, suspected malignancy, appenddicolith, age <18 or >60, peritonitis, serious comorbidities). We discussed the surgical procedure including laparoscopic and potential open techniques. We discussed the material risks (including, but not limited to, pain, bleeding, infection, scarring, need for blood transfusion, damage to surrounding structures- blood vessels/nerves/viscus/organs, damage to ureter/bladder, urine leak, leak from staple line, need for additional procedures, hernia, recurrence although quite low, pneumonia, heart attack, stroke, death) benefits and alternatives to surgery were discussed at length. The patient's questions were answered to his satisfaction, he voiced understanding and after considering his options elected to proceed with surgery. Additionally, we discussed typical postoperative expectations and the recovery process. -COVID test pending as per hospital policy; depending on timing of results or other emergent procedures going on we discussed proceeding this evening vs tomorrow for surgery based on OR availability -He requested we update his wife following and her number has been written on the surgical consent  Stephanie CoupChristopher M. Cliffton AstersWhite, M.D. [facility]

## 2019-01-11 DIAGNOSIS — K3580 Unspecified acute appendicitis: Secondary | ICD-10-CM | POA: Diagnosis not present

## 2019-01-11 DIAGNOSIS — Z9049 Acquired absence of other specified parts of digestive tract: Secondary | ICD-10-CM

## 2019-01-11 LAB — CBC
HCT: 41.7 % (ref 39.0–52.0)
Hemoglobin: 13.8 g/dL (ref 13.0–17.0)
MCH: 28.7 pg (ref 26.0–34.0)
MCHC: 33.1 g/dL (ref 30.0–36.0)
MCV: 86.7 fL (ref 80.0–100.0)
Platelets: 259 10*3/uL (ref 150–400)
RBC: 4.81 MIL/uL (ref 4.22–5.81)
RDW: 11.9 % (ref 11.5–15.5)
WBC: 11.2 10*3/uL — ABNORMAL HIGH (ref 4.0–10.5)
nRBC: 0 % (ref 0.0–0.2)

## 2019-01-11 MED ORDER — FENTANYL CITRATE (PF) 100 MCG/2ML IJ SOLN
INTRAMUSCULAR | Status: AC
  Filled 2019-01-11: qty 2

## 2019-01-11 MED ORDER — FENTANYL CITRATE (PF) 100 MCG/2ML IJ SOLN
25.0000 ug | INTRAMUSCULAR | Status: DC | PRN
  Administered 2019-01-11 (×2): 50 ug via INTRAVENOUS

## 2019-01-11 MED ORDER — DIPHENHYDRAMINE HCL 12.5 MG/5ML PO ELIX: 12.5000 mg | ORAL_SOLUTION | Freq: Four times a day (QID) | ORAL | Status: DC | PRN

## 2019-01-11 MED ORDER — LACTATED RINGERS IV SOLN
INTRAVENOUS | Status: DC
  Administered 2019-01-11: 01:00:00 via INTRAVENOUS

## 2019-01-11 MED ORDER — SIMETHICONE 80 MG PO CHEW: 40.0000 mg | CHEWABLE_TABLET | Freq: Four times a day (QID) | ORAL | Status: DC | PRN

## 2019-01-11 MED ORDER — ONDANSETRON HCL 4 MG/2ML IJ SOLN
INTRAMUSCULAR | Status: DC | PRN
  Administered 2019-01-11: 4 mg via INTRAVENOUS

## 2019-01-11 MED ORDER — HEPARIN SODIUM (PORCINE) 5000 UNIT/ML IJ SOLN: 5000.0000 [IU] | Freq: Three times a day (TID) | INTRAMUSCULAR | Status: DC

## 2019-01-11 MED ORDER — DEXAMETHASONE SODIUM PHOSPHATE 10 MG/ML IJ SOLN
INTRAMUSCULAR | Status: DC | PRN
  Administered 2019-01-11: 10 mg via INTRAVENOUS

## 2019-01-11 MED ORDER — HYDRALAZINE HCL 20 MG/ML IJ SOLN: 10.0000 mg | INTRAMUSCULAR | Status: DC | PRN

## 2019-01-11 MED ORDER — IBUPROFEN 600 MG PO TABS: 600.0000 mg | ORAL_TABLET | Freq: Four times a day (QID) | ORAL | Status: DC | PRN

## 2019-01-11 MED ORDER — ONDANSETRON HCL 4 MG/2ML IJ SOLN: 4.0000 mg | Freq: Four times a day (QID) | INTRAMUSCULAR | Status: DC | PRN

## 2019-01-11 MED ORDER — ACETAMINOPHEN 325 MG PO TABS
650.0000 mg | ORAL_TABLET | Freq: Four times a day (QID) | ORAL | Status: DC
  Administered 2019-01-11 (×2): 650 mg via ORAL
  Filled 2019-01-11 (×2): qty 2

## 2019-01-11 MED ORDER — TRAMADOL HCL 50 MG PO TABS: 50.0000 mg | ORAL_TABLET | Freq: Four times a day (QID) | ORAL | Status: DC | PRN

## 2019-01-11 MED ORDER — HYDROMORPHONE HCL 1 MG/ML IJ SOLN: 0.5000 mg | INTRAMUSCULAR | Status: DC | PRN

## 2019-01-11 MED ORDER — SUGAMMADEX SODIUM 200 MG/2ML IV SOLN
INTRAVENOUS | Status: DC | PRN
  Administered 2019-01-11: 200 mg via INTRAVENOUS

## 2019-01-11 MED ORDER — DOCUSATE SODIUM 100 MG PO CAPS
100.0000 mg | ORAL_CAPSULE | Freq: Two times a day (BID) | ORAL | Status: DC
  Administered 2019-01-11: 100 mg via ORAL
  Filled 2019-01-11: qty 1

## 2019-01-11 MED ORDER — ONDANSETRON 4 MG PO TBDP: 4.0000 mg | ORAL_TABLET | Freq: Four times a day (QID) | ORAL | Status: DC | PRN

## 2019-01-11 MED ORDER — DIPHENHYDRAMINE HCL 50 MG/ML IJ SOLN: 12.5000 mg | Freq: Four times a day (QID) | INTRAMUSCULAR | Status: DC | PRN

## 2019-01-11 NOTE — Plan of Care (Signed)
  Problem: Clinical Measurements: Goal: Postoperative complications will be avoided or minimized Outcome: Progressing   

## 2019-01-11 NOTE — Plan of Care (Signed)
  Problem: Education: Goal: Required Educational Video(s) Outcome: Completed/Met   Problem: Clinical Measurements: Goal: Postoperative complications will be avoided or minimized Outcome: Completed/Met   Problem: Skin Integrity: Goal: Demonstration of wound healing without infection will improve Outcome: Completed/Met   Problem: Education: Goal: Knowledge of General Education information will improve Description: Including pain rating scale, medication(s)/side effects and non-pharmacologic comfort measures Outcome: Completed/Met   Problem: Health Behavior/Discharge Planning: Goal: Ability to manage health-related needs will improve Outcome: Completed/Met   Problem: Clinical Measurements: Goal: Ability to maintain clinical measurements within normal limits will improve Outcome: Completed/Met Goal: Will remain free from infection Outcome: Completed/Met Goal: Diagnostic test results will improve Outcome: Completed/Met Goal: Respiratory complications will improve Outcome: Completed/Met Goal: Cardiovascular complication will be avoided Outcome: Completed/Met   Problem: Activity: Goal: Risk for activity intolerance will decrease Outcome: Completed/Met   Problem: Nutrition: Goal: Adequate nutrition will be maintained Outcome: Completed/Met   Problem: Coping: Goal: Level of anxiety will decrease Outcome: Completed/Met   Problem: Elimination: Goal: Will not experience complications related to bowel motility Outcome: Completed/Met Goal: Will not experience complications related to urinary retention Outcome: Completed/Met   Problem: Pain Managment: Goal: General experience of comfort will improve Outcome: Completed/Met   Problem: Safety: Goal: Ability to remain free from injury will improve Outcome: Completed/Met   Problem: Skin Integrity: Goal: Risk for impaired skin integrity will decrease Outcome: Completed/Met

## 2019-01-11 NOTE — Progress Notes (Signed)
Patient admitted from  PACU alert and oriented x4. Vital signs stable.Pain 0/10. Surgical incision x3 clean,dry and intact. Oriented to room and call bell.

## 2019-01-11 NOTE — Transfer of Care (Signed)
Immediate Anesthesia Transfer of Care Note  Patient: Nicholas Frederick  Procedure(s) Performed: APPENDECTOMY LAPAROSCOPIC (N/A Abdomen)  Patient Location: PACU  Anesthesia Type:General  Level of Consciousness: awake  Airway & Oxygen Therapy: Patient Spontanous Breathing  Post-op Assessment: Report given to RN and Post -op Vital signs reviewed and stable  Post vital signs: Reviewed and stable  Last Vitals:  Vitals Value Taken Time  BP    Temp    Pulse 92 01/11/19 0051  Resp 16 01/11/19 0051  SpO2 93 % 01/11/19 0051  Vitals shown include unvalidated device data.  Last Pain:  Vitals:   01/10/19 2040  TempSrc:   PainSc: 0-No pain         Complications: No apparent anesthesia complications

## 2019-01-11 NOTE — Discharge Summary (Signed)
Physician Discharge Summary [facility]  Patient ID: Nicholas Frederick MRN: [id number] DOB/AGE: 33/07/1985 33 y.o.  Admit date: 01/10/2019 Discharge date: 01/11/2019  Admission Diagnoses:  Acute appendicitis  Discharge Diagnoses:  Active Problems:   S/P laparoscopic appendectomy   Discharged Condition: good  Hospital Course: Patient was admitted for observation following appendectomy.  Post op course was uncomplicated.  Pain was well controlled.  Tolerated diet.  Patient was prepared for discharge home on POD#1.  Consults: None  Treatments: surgery: laparoscopic appendectomy  Discharge Exam: Blood pressure 123/71, pulse 72, temperature 99 F (37.2 C), temperature source Oral, resp. rate 18, height 5\' 10"  (1.778 m), weight 86.2 kg, SpO2 97 %. HEENT - clear Neck - soft Chest - clear bilaterally Cor - RRR Abd - wounds clear and dry with Dermabond in place  Disposition: Home  Discharge Instructions    Diet - low sodium heart healthy   Complete by: As directed    Discharge instructions   Complete by: As directed    [facility]  LAPAROSCOPIC SURGERY:  POST-OP INSTRUCTIONS  Always review your discharge instruction sheet given to you by the facility where your surgery was performed.  A prescription for pain medication may be given to you upon discharge.  Take your pain medication as prescribed.  If narcotic pain medicine is not needed, then you may take acetaminophen (Tylenol) or ibuprofen (Advil) as needed.  Take your usually prescribed medications unless otherwise directed.  If you need a refill on your pain medication, please contact your pharmacy.  They will contact our office to request authorization. Prescriptions will not be filled after 5 P.M. or on weekends.  You should follow a light diet the first few days after arrival home, such as soup and crackers or toast.  Be sure to include plenty of fluids daily.  Most patients will  experience some swelling and bruising in the area of the incisions.  Ice packs will help.  Swelling and bruising can take several days to resolve.   It is common to experience some constipation after surgery.  Increasing fluid intake and taking a stool softener (such as Colace) will usually help or prevent this problem from occurring.  A mild laxative (Milk of Magnesia or Miralax) should be taken according to package instructions if there has been no bowel movement after 48 hours.  You will likely have Dermabond (topical glue) over your incisions.  This seals the incisions and allows you to bathe and shower at any time after your surgery.  Glue should remain in place for up to 10 days.  It may be removed after 10 days by pealing off the Dermabond material or using Vaseline or naval jelly to remove.  If you have steri-strips over your incisions, you may remove the gauze bandage on the second day after surgery, and you may shower at that time.  Leave your steri-strips (small skin tapes) in place directly over the incision.  These strips should remain on the skin for 5-7 days and then be removed.  You may get them wet in the shower and pat them dry.  Any sutures or staples will be removed at the office during your follow-up visit.  ACTIVITIES:  You may resume regular (light) daily activities beginning the next day - such as daily self-care, walking, climbing stairs - gradually increasing activities as tolerated.  You may have sexual intercourse when it is comfortable.  Refrain from any heavy lifting or straining  until approved by your doctor.  You may drive when you are no longer taking prescription pain medication, when you can comfortably wear a seatbelt, and when you can safely maneuver your car and apply brakes.  You should see your doctor in the office for a follow-up appointment approximately 2-3 weeks after your surgery.  Make sure that you call for this appointment within a day or two after you  arrive home to insure a convenient appointment time.  WHEN TO CALL YOUR DOCTOR: Fever over 101.0 Inability to urinate Continued bleeding from incision Increased pain, redness, or drainage from the incision Increasing abdominal pain  The clinic staff is available to answer your questions during regular business hours.  Please don't hesitate to call and ask to speak to one of the nurses for clinical concerns.  If you have a medical emergency, go to the nearest emergency room or call 911.  A surgeon from [facility] is always on call for the hospital.  Earnstine Regal, MD, [facility] Office: [place] Free:  [place] [phone]  Website: [email or web address]   Increase activity slowly   Complete by: As directed    No dressing needed   Complete by: As directed      Allergies as of 01/11/2019   No Known Allergies     Medication List    TAKE these medications   cetirizine 10 MG tablet Commonly known as: ZYRTEC Take 10 mg by mouth daily.   fluticasone 50 MCG/ACT nasal spray Commonly known as: FLONASE Place 2 sprays into both nostrils daily.        Earnstine Regal, MD, [facility] Office: [phone]   Signed: Armandina Gemma 01/11/2019, 8:53 AM

## 2019-01-11 NOTE — Progress Notes (Signed)
Hadley Pen to be D/C'd  per MD order. Discussed with the patient and all questions fully answered.  VSS, Skin clean, dry and intact without evidence of skin break down, no evidence of skin tears noted.  IV catheter discontinued intact. Site without signs and symptoms of complications. Dressing and pressure applied.  An After Visit Summary was printed and given to the patient. Patient received prescription.  D/c education completed with patient/family including follow up instructions, medication list, d/c activities limitations if indicated, with other d/c instructions as indicated by MD - patient able to verbalize understanding, all questions fully answered.   Patient instructed to return to ED, call 911, or call MD for any changes in condition.   Patient to be escorted via [place], and D/C home via private auto.

## 2019-01-11 NOTE — Op Note (Signed)
Nicholas Merlndrew [place] [id number]   PRE-OPERATIVE DIAGNOSIS:  Acute appendicitis without perforation or gangrene  POST-OPERATIVE DIAGNOSIS:  Same  Procedure(s): APPENDECTOMY LAPAROSCOPIC  PROCEDURE: Laparoscopic appendectomy  SURGEON:  Stephanie Couphristopher M. Esparanza Krider, M.D.  ASSISTANT: OR staff  ANESTHESIA: General endotracheal  EBL:  5 mL  DRAINS: None  SPECIMEN:  Appendix  COUNTS:  Sponge, needle and instrument counts were reported correct x2 at conclusion of the operation  DISPOSITION:  PACU in satisfactory condition  COMPLICATIONS: None  FINDINGS: Acutely inflamed appendix. No frank perforation or abscess. No gangrene.  INDICATIONS: Nicholas Frederick is an 33 y.o. male with prior hx of HTN (now off medication) whom presented to the emergency room today with complaints of onset of periumbilical abdominal pain around 11 AM today.  He reports having some right-sided back pain last night but nothing else.  Throughout the day and afternoon the pain subsequently localized to his right lower quadrant.  He denies any fever/chills/nausea/vomiting.  He reports normal bowel movements with his last bowel movement yesterday.  He denies ever having had this pain before.  He did take some Advil earlier today and noted some mild improvement in his pain. He was tender in the RLQ on exam. WBC 14. CT showed evidence of acute appendicitis without evidence of perforation or abscess. Options were discussed and he opted to pursue surgery. Please refer to notes elsewhere in the EMR for details regarding this discussion.  DESCRIPTION:   The patient was identified & brought into the operating room. SCDs were in place and functioning. General endotracheal anesthesia was administered. Preoperative antibiotics were administered. The patient was positioned supine with left arm tucked. Hair on the abdomen was then clipped by the OR team. A foley catheter was inserted under sterile conditions. The abdomen was prepped and draped  in the standard sterile fashion. A surgical timeout confirmed our plan.  A small incision was made in the infraumbilical fold. The subcutaneous tissue was dissected and the umbilical stalk identified. The stalk was grasped with a Kocher and retracted outwardly. The infraumbilical fascia was exposed and incised. Peritoneal entry was carefully made bluntly. A 0 Vicryl purse-string suture was placed and [facility] introduced into the abdomen.  CO2 insufflation commenced to 15mmHg. The laparoscope was inserted and confirmed no evidence of trocar site complications. The patient was then positioned in Trendelenburg. Two additional 5 mm ports were placed - one in left lower quadrant and another in the suprapubic midline taking care to stay well above the bladder - 3 fingerbreadths above the pubic symphysis. The bed was then slightly tilted to place the left side down.  The terminal ileum was identified. Adhesions to the retroperitoneum were divided sharply to allow the terminal ileum to flip cephalad and expose the appendix. This was gently mobilized sharply up to the cecum in a lateral to medial fashion. Care was taken to avoid injuring any retroperitoneal structures. The appendix was identified and attachments to the appendix to the surrounding tissues were bluntly freed without difficulty.  The appendix was elevated.  The base of the appendix was circumferentially dissected taking care to preserve the cecum free of injury. The base was noted to be viable and healthy appearing. The terminal ileum, cecum and ascending colon also appeared normal. The base of the appendix was then stapled with a blue load, taking a small healthy cuff of viable cecum, taking care to stay clear of the ileocecal valve. The mesoappendix was then ligated by "hugging" the appendix using  the harmonic scalpel. The mesoappendix was inspected and noted to be hemostatic. The appendix was placed in an EndoBag.  The right lower  quadrant was conservatively irrigated. Hemostasis was noted to be achieved - taking time to inspect the ligated mesoappendix, colon mesentery, and retroperitoneum. Staple line was noted to be intact on the cecum with no bleeding. There was no perforation or injury. The right lower quadrant appeared clean and as such, no drain was placed.  The left lower quadrant and suprapubic ports were removed under direct visualization. The EndoBag was then removed through the umbilical port site and passed off as specimen. The CO2 was exhausted from the abdomen. The umbilical fascia was then closed by closing the 0 Vicryl suture. The fascia was palpated and noted to be completely closed. The skin of all port sites was then approximated using 4-0 Monocryl suture. The incisions were covered with Dermabond.  He was then awakened from general anesthesia, extubated, and transferred to a stretcher for transport to recover in satisfactory condition.

## 2019-01-12 NOTE — Anesthesia Postprocedure Evaluation (Signed)
Anesthesia Post Note  Patient: Nicholas Frederick  Procedure(s) Performed: APPENDECTOMY LAPAROSCOPIC (N/A Abdomen)     Patient location during evaluation: PACU Anesthesia Type: General Level of consciousness: awake Pain management: pain level controlled Vital Signs Assessment: post-procedure vital signs reviewed and stable Respiratory status: patient remains intubated per anesthesia plan Cardiovascular status: stable Postop Assessment: no apparent nausea or vomiting Anesthetic complications: no    Last Vitals:  Vitals:   01/11/19 0148 01/11/19 0436  BP: (!) 146/91 123/71  Pulse: 83 72  Resp: 18 18  Temp: 36.9 C 37.2 C  SpO2: 96% 97%    Last Pain:  Vitals:   01/11/19 0831  TempSrc:   PainSc: 2                  Nicholas Frederick

## 2019-01-13 ENCOUNTER — Encounter (HOSPITAL_COMMUNITY): Payer: Self-pay | Admitting: Surgery

## 2019-01-14 DIAGNOSIS — J3081 Allergic rhinitis due to animal (cat) (dog) hair and dander: Secondary | ICD-10-CM | POA: Diagnosis not present

## 2019-01-14 DIAGNOSIS — J3089 Other allergic rhinitis: Secondary | ICD-10-CM | POA: Diagnosis not present

## 2019-01-14 DIAGNOSIS — J301 Allergic rhinitis due to pollen: Secondary | ICD-10-CM | POA: Diagnosis not present

## 2019-01-21 DIAGNOSIS — J3089 Other allergic rhinitis: Secondary | ICD-10-CM | POA: Diagnosis not present

## 2019-01-21 DIAGNOSIS — J301 Allergic rhinitis due to pollen: Secondary | ICD-10-CM | POA: Diagnosis not present

## 2019-01-21 DIAGNOSIS — J3081 Allergic rhinitis due to animal (cat) (dog) hair and dander: Secondary | ICD-10-CM | POA: Diagnosis not present

## 2019-01-28 DIAGNOSIS — J3089 Other allergic rhinitis: Secondary | ICD-10-CM | POA: Diagnosis not present

## 2019-01-28 DIAGNOSIS — J3081 Allergic rhinitis due to animal (cat) (dog) hair and dander: Secondary | ICD-10-CM | POA: Diagnosis not present

## 2019-01-28 DIAGNOSIS — J301 Allergic rhinitis due to pollen: Secondary | ICD-10-CM | POA: Diagnosis not present

## 2019-02-04 DIAGNOSIS — J3089 Other allergic rhinitis: Secondary | ICD-10-CM | POA: Diagnosis not present

## 2019-02-04 DIAGNOSIS — J301 Allergic rhinitis due to pollen: Secondary | ICD-10-CM | POA: Diagnosis not present

## 2019-02-04 DIAGNOSIS — J3081 Allergic rhinitis due to animal (cat) (dog) hair and dander: Secondary | ICD-10-CM | POA: Diagnosis not present

## 2019-02-11 DIAGNOSIS — J301 Allergic rhinitis due to pollen: Secondary | ICD-10-CM | POA: Diagnosis not present

## 2019-02-11 DIAGNOSIS — J3089 Other allergic rhinitis: Secondary | ICD-10-CM | POA: Diagnosis not present

## 2019-02-11 DIAGNOSIS — J3081 Allergic rhinitis due to animal (cat) (dog) hair and dander: Secondary | ICD-10-CM | POA: Diagnosis not present

## 2019-02-18 DIAGNOSIS — J3081 Allergic rhinitis due to animal (cat) (dog) hair and dander: Secondary | ICD-10-CM | POA: Diagnosis not present

## 2019-02-18 DIAGNOSIS — J301 Allergic rhinitis due to pollen: Secondary | ICD-10-CM | POA: Diagnosis not present

## 2019-02-18 DIAGNOSIS — J3089 Other allergic rhinitis: Secondary | ICD-10-CM | POA: Diagnosis not present

## 2019-02-25 DIAGNOSIS — J3081 Allergic rhinitis due to animal (cat) (dog) hair and dander: Secondary | ICD-10-CM | POA: Diagnosis not present

## 2019-02-25 DIAGNOSIS — J301 Allergic rhinitis due to pollen: Secondary | ICD-10-CM | POA: Diagnosis not present

## 2019-02-25 DIAGNOSIS — J3089 Other allergic rhinitis: Secondary | ICD-10-CM | POA: Diagnosis not present

## 2019-03-04 DIAGNOSIS — J3081 Allergic rhinitis due to animal (cat) (dog) hair and dander: Secondary | ICD-10-CM | POA: Diagnosis not present

## 2019-03-04 DIAGNOSIS — J3089 Other allergic rhinitis: Secondary | ICD-10-CM | POA: Diagnosis not present

## 2019-03-04 DIAGNOSIS — J301 Allergic rhinitis due to pollen: Secondary | ICD-10-CM | POA: Diagnosis not present

## 2019-03-11 DIAGNOSIS — J3089 Other allergic rhinitis: Secondary | ICD-10-CM | POA: Diagnosis not present

## 2019-03-11 DIAGNOSIS — J301 Allergic rhinitis due to pollen: Secondary | ICD-10-CM | POA: Diagnosis not present

## 2019-03-11 DIAGNOSIS — J3081 Allergic rhinitis due to animal (cat) (dog) hair and dander: Secondary | ICD-10-CM | POA: Diagnosis not present

## 2019-03-12 DIAGNOSIS — E663 Overweight: Secondary | ICD-10-CM | POA: Diagnosis not present

## 2019-03-12 DIAGNOSIS — Z0000 Encounter for general adult medical examination without abnormal findings: Secondary | ICD-10-CM | POA: Diagnosis not present

## 2019-03-14 DIAGNOSIS — R82998 Other abnormal findings in urine: Secondary | ICD-10-CM | POA: Diagnosis not present

## 2019-03-17 DIAGNOSIS — F900 Attention-deficit hyperactivity disorder, predominantly inattentive type: Secondary | ICD-10-CM | POA: Diagnosis not present

## 2019-03-17 DIAGNOSIS — R030 Elevated blood-pressure reading, without diagnosis of hypertension: Secondary | ICD-10-CM | POA: Diagnosis not present

## 2019-03-17 DIAGNOSIS — J302 Other seasonal allergic rhinitis: Secondary | ICD-10-CM | POA: Diagnosis not present

## 2019-03-17 DIAGNOSIS — Z8679 Personal history of other diseases of the circulatory system: Secondary | ICD-10-CM | POA: Diagnosis not present

## 2019-03-17 DIAGNOSIS — Z0000 Encounter for general adult medical examination without abnormal findings: Secondary | ICD-10-CM | POA: Diagnosis not present

## 2019-03-18 DIAGNOSIS — J3089 Other allergic rhinitis: Secondary | ICD-10-CM | POA: Diagnosis not present

## 2019-03-18 DIAGNOSIS — J301 Allergic rhinitis due to pollen: Secondary | ICD-10-CM | POA: Diagnosis not present

## 2019-03-18 DIAGNOSIS — J3081 Allergic rhinitis due to animal (cat) (dog) hair and dander: Secondary | ICD-10-CM | POA: Diagnosis not present

## 2019-03-25 DIAGNOSIS — J3089 Other allergic rhinitis: Secondary | ICD-10-CM | POA: Diagnosis not present

## 2019-03-25 DIAGNOSIS — J301 Allergic rhinitis due to pollen: Secondary | ICD-10-CM | POA: Diagnosis not present

## 2019-03-25 DIAGNOSIS — J3081 Allergic rhinitis due to animal (cat) (dog) hair and dander: Secondary | ICD-10-CM | POA: Diagnosis not present

## 2019-04-01 DIAGNOSIS — J3089 Other allergic rhinitis: Secondary | ICD-10-CM | POA: Diagnosis not present

## 2019-04-01 DIAGNOSIS — J301 Allergic rhinitis due to pollen: Secondary | ICD-10-CM | POA: Diagnosis not present

## 2019-04-01 DIAGNOSIS — J3081 Allergic rhinitis due to animal (cat) (dog) hair and dander: Secondary | ICD-10-CM | POA: Diagnosis not present

## 2019-04-08 DIAGNOSIS — J3089 Other allergic rhinitis: Secondary | ICD-10-CM | POA: Diagnosis not present

## 2019-04-08 DIAGNOSIS — J3081 Allergic rhinitis due to animal (cat) (dog) hair and dander: Secondary | ICD-10-CM | POA: Diagnosis not present

## 2019-04-08 DIAGNOSIS — J301 Allergic rhinitis due to pollen: Secondary | ICD-10-CM | POA: Diagnosis not present

## 2019-04-15 DIAGNOSIS — J3089 Other allergic rhinitis: Secondary | ICD-10-CM | POA: Diagnosis not present

## 2019-04-15 DIAGNOSIS — J301 Allergic rhinitis due to pollen: Secondary | ICD-10-CM | POA: Diagnosis not present

## 2019-04-15 DIAGNOSIS — J3081 Allergic rhinitis due to animal (cat) (dog) hair and dander: Secondary | ICD-10-CM | POA: Diagnosis not present

## 2019-04-22 DIAGNOSIS — J3089 Other allergic rhinitis: Secondary | ICD-10-CM | POA: Diagnosis not present

## 2019-04-22 DIAGNOSIS — J301 Allergic rhinitis due to pollen: Secondary | ICD-10-CM | POA: Diagnosis not present

## 2019-04-22 DIAGNOSIS — J3081 Allergic rhinitis due to animal (cat) (dog) hair and dander: Secondary | ICD-10-CM | POA: Diagnosis not present

## 2019-04-29 DIAGNOSIS — J301 Allergic rhinitis due to pollen: Secondary | ICD-10-CM | POA: Diagnosis not present

## 2019-04-29 DIAGNOSIS — J3089 Other allergic rhinitis: Secondary | ICD-10-CM | POA: Diagnosis not present

## 2019-04-29 DIAGNOSIS — J3081 Allergic rhinitis due to animal (cat) (dog) hair and dander: Secondary | ICD-10-CM | POA: Diagnosis not present

## 2019-05-06 DIAGNOSIS — J3081 Allergic rhinitis due to animal (cat) (dog) hair and dander: Secondary | ICD-10-CM | POA: Diagnosis not present

## 2019-05-06 DIAGNOSIS — J301 Allergic rhinitis due to pollen: Secondary | ICD-10-CM | POA: Diagnosis not present

## 2019-05-06 DIAGNOSIS — J3089 Other allergic rhinitis: Secondary | ICD-10-CM | POA: Diagnosis not present

## 2019-05-13 DIAGNOSIS — J301 Allergic rhinitis due to pollen: Secondary | ICD-10-CM | POA: Diagnosis not present

## 2019-05-13 DIAGNOSIS — J3089 Other allergic rhinitis: Secondary | ICD-10-CM | POA: Diagnosis not present

## 2019-05-13 DIAGNOSIS — J3081 Allergic rhinitis due to animal (cat) (dog) hair and dander: Secondary | ICD-10-CM | POA: Diagnosis not present

## 2019-05-19 DIAGNOSIS — H1045 Other chronic allergic conjunctivitis: Secondary | ICD-10-CM | POA: Diagnosis not present

## 2019-05-19 DIAGNOSIS — J301 Allergic rhinitis due to pollen: Secondary | ICD-10-CM | POA: Diagnosis not present

## 2019-05-19 DIAGNOSIS — J3089 Other allergic rhinitis: Secondary | ICD-10-CM | POA: Diagnosis not present

## 2019-05-19 DIAGNOSIS — J3081 Allergic rhinitis due to animal (cat) (dog) hair and dander: Secondary | ICD-10-CM | POA: Diagnosis not present

## 2019-05-27 DIAGNOSIS — J3081 Allergic rhinitis due to animal (cat) (dog) hair and dander: Secondary | ICD-10-CM | POA: Diagnosis not present

## 2019-05-27 DIAGNOSIS — J301 Allergic rhinitis due to pollen: Secondary | ICD-10-CM | POA: Diagnosis not present

## 2019-05-27 DIAGNOSIS — J3089 Other allergic rhinitis: Secondary | ICD-10-CM | POA: Diagnosis not present

## 2019-06-03 DIAGNOSIS — J301 Allergic rhinitis due to pollen: Secondary | ICD-10-CM | POA: Diagnosis not present

## 2019-06-03 DIAGNOSIS — J3089 Other allergic rhinitis: Secondary | ICD-10-CM | POA: Diagnosis not present

## 2019-06-03 DIAGNOSIS — J3081 Allergic rhinitis due to animal (cat) (dog) hair and dander: Secondary | ICD-10-CM | POA: Diagnosis not present

## 2019-06-05 DIAGNOSIS — J3081 Allergic rhinitis due to animal (cat) (dog) hair and dander: Secondary | ICD-10-CM | POA: Diagnosis not present

## 2019-06-05 DIAGNOSIS — J301 Allergic rhinitis due to pollen: Secondary | ICD-10-CM | POA: Diagnosis not present

## 2019-06-06 DIAGNOSIS — J3089 Other allergic rhinitis: Secondary | ICD-10-CM | POA: Diagnosis not present

## 2019-06-12 DIAGNOSIS — J301 Allergic rhinitis due to pollen: Secondary | ICD-10-CM | POA: Diagnosis not present

## 2019-06-12 DIAGNOSIS — J3089 Other allergic rhinitis: Secondary | ICD-10-CM | POA: Diagnosis not present

## 2019-06-12 DIAGNOSIS — J3081 Allergic rhinitis due to animal (cat) (dog) hair and dander: Secondary | ICD-10-CM | POA: Diagnosis not present

## 2019-06-24 DIAGNOSIS — J3081 Allergic rhinitis due to animal (cat) (dog) hair and dander: Secondary | ICD-10-CM | POA: Diagnosis not present

## 2019-06-24 DIAGNOSIS — J3089 Other allergic rhinitis: Secondary | ICD-10-CM | POA: Diagnosis not present

## 2019-06-24 DIAGNOSIS — J301 Allergic rhinitis due to pollen: Secondary | ICD-10-CM | POA: Diagnosis not present

## 2019-07-03 DIAGNOSIS — J301 Allergic rhinitis due to pollen: Secondary | ICD-10-CM | POA: Diagnosis not present

## 2019-07-03 DIAGNOSIS — J3089 Other allergic rhinitis: Secondary | ICD-10-CM | POA: Diagnosis not present

## 2019-07-03 DIAGNOSIS — J3081 Allergic rhinitis due to animal (cat) (dog) hair and dander: Secondary | ICD-10-CM | POA: Diagnosis not present

## 2019-07-10 DIAGNOSIS — J301 Allergic rhinitis due to pollen: Secondary | ICD-10-CM | POA: Diagnosis not present

## 2019-07-10 DIAGNOSIS — J3089 Other allergic rhinitis: Secondary | ICD-10-CM | POA: Diagnosis not present

## 2019-07-10 DIAGNOSIS — J3081 Allergic rhinitis due to animal (cat) (dog) hair and dander: Secondary | ICD-10-CM | POA: Diagnosis not present

## 2019-07-22 DIAGNOSIS — J301 Allergic rhinitis due to pollen: Secondary | ICD-10-CM | POA: Diagnosis not present

## 2019-07-22 DIAGNOSIS — J3081 Allergic rhinitis due to animal (cat) (dog) hair and dander: Secondary | ICD-10-CM | POA: Diagnosis not present

## 2019-07-22 DIAGNOSIS — J3089 Other allergic rhinitis: Secondary | ICD-10-CM | POA: Diagnosis not present

## 2019-07-31 DIAGNOSIS — J3081 Allergic rhinitis due to animal (cat) (dog) hair and dander: Secondary | ICD-10-CM | POA: Diagnosis not present

## 2019-07-31 DIAGNOSIS — J301 Allergic rhinitis due to pollen: Secondary | ICD-10-CM | POA: Diagnosis not present

## 2019-07-31 DIAGNOSIS — J3089 Other allergic rhinitis: Secondary | ICD-10-CM | POA: Diagnosis not present

## 2019-08-05 DIAGNOSIS — J301 Allergic rhinitis due to pollen: Secondary | ICD-10-CM | POA: Diagnosis not present

## 2019-08-05 DIAGNOSIS — J3089 Other allergic rhinitis: Secondary | ICD-10-CM | POA: Diagnosis not present

## 2019-08-05 DIAGNOSIS — J3081 Allergic rhinitis due to animal (cat) (dog) hair and dander: Secondary | ICD-10-CM | POA: Diagnosis not present

## 2019-08-08 DIAGNOSIS — J3089 Other allergic rhinitis: Secondary | ICD-10-CM | POA: Diagnosis not present

## 2019-08-08 DIAGNOSIS — J301 Allergic rhinitis due to pollen: Secondary | ICD-10-CM | POA: Diagnosis not present

## 2019-08-08 DIAGNOSIS — J3081 Allergic rhinitis due to animal (cat) (dog) hair and dander: Secondary | ICD-10-CM | POA: Diagnosis not present

## 2019-08-14 DIAGNOSIS — J3089 Other allergic rhinitis: Secondary | ICD-10-CM | POA: Diagnosis not present

## 2019-08-14 DIAGNOSIS — J3081 Allergic rhinitis due to animal (cat) (dog) hair and dander: Secondary | ICD-10-CM | POA: Diagnosis not present

## 2019-08-14 DIAGNOSIS — J301 Allergic rhinitis due to pollen: Secondary | ICD-10-CM | POA: Diagnosis not present

## 2019-08-21 DIAGNOSIS — J301 Allergic rhinitis due to pollen: Secondary | ICD-10-CM | POA: Diagnosis not present

## 2019-08-21 DIAGNOSIS — J3081 Allergic rhinitis due to animal (cat) (dog) hair and dander: Secondary | ICD-10-CM | POA: Diagnosis not present

## 2019-08-21 DIAGNOSIS — J3089 Other allergic rhinitis: Secondary | ICD-10-CM | POA: Diagnosis not present

## 2019-08-26 DIAGNOSIS — J3081 Allergic rhinitis due to animal (cat) (dog) hair and dander: Secondary | ICD-10-CM | POA: Diagnosis not present

## 2019-08-26 DIAGNOSIS — J3089 Other allergic rhinitis: Secondary | ICD-10-CM | POA: Diagnosis not present

## 2019-08-26 DIAGNOSIS — J301 Allergic rhinitis due to pollen: Secondary | ICD-10-CM | POA: Diagnosis not present

## 2019-09-02 DIAGNOSIS — J3089 Other allergic rhinitis: Secondary | ICD-10-CM | POA: Diagnosis not present

## 2019-09-02 DIAGNOSIS — J301 Allergic rhinitis due to pollen: Secondary | ICD-10-CM | POA: Diagnosis not present

## 2019-09-02 DIAGNOSIS — J3081 Allergic rhinitis due to animal (cat) (dog) hair and dander: Secondary | ICD-10-CM | POA: Diagnosis not present

## 2019-09-15 DIAGNOSIS — J301 Allergic rhinitis due to pollen: Secondary | ICD-10-CM | POA: Diagnosis not present

## 2019-09-15 DIAGNOSIS — J3081 Allergic rhinitis due to animal (cat) (dog) hair and dander: Secondary | ICD-10-CM | POA: Diagnosis not present

## 2019-09-15 DIAGNOSIS — J3089 Other allergic rhinitis: Secondary | ICD-10-CM | POA: Diagnosis not present

## 2019-09-22 DIAGNOSIS — R030 Elevated blood-pressure reading, without diagnosis of hypertension: Secondary | ICD-10-CM | POA: Diagnosis not present

## 2019-09-22 DIAGNOSIS — J302 Other seasonal allergic rhinitis: Secondary | ICD-10-CM | POA: Diagnosis not present

## 2019-09-22 DIAGNOSIS — Z1331 Encounter for screening for depression: Secondary | ICD-10-CM | POA: Diagnosis not present

## 2019-09-22 DIAGNOSIS — F900 Attention-deficit hyperactivity disorder, predominantly inattentive type: Secondary | ICD-10-CM | POA: Diagnosis not present

## 2019-09-23 DIAGNOSIS — J3089 Other allergic rhinitis: Secondary | ICD-10-CM | POA: Diagnosis not present

## 2019-09-23 DIAGNOSIS — J3081 Allergic rhinitis due to animal (cat) (dog) hair and dander: Secondary | ICD-10-CM | POA: Diagnosis not present

## 2019-09-23 DIAGNOSIS — J301 Allergic rhinitis due to pollen: Secondary | ICD-10-CM | POA: Diagnosis not present

## 2019-10-01 DIAGNOSIS — J301 Allergic rhinitis due to pollen: Secondary | ICD-10-CM | POA: Diagnosis not present

## 2019-10-01 DIAGNOSIS — J3081 Allergic rhinitis due to animal (cat) (dog) hair and dander: Secondary | ICD-10-CM | POA: Diagnosis not present

## 2019-10-01 DIAGNOSIS — J3089 Other allergic rhinitis: Secondary | ICD-10-CM | POA: Diagnosis not present

## 2019-10-06 DIAGNOSIS — Z03818 Encounter for observation for suspected exposure to other biological agents ruled out: Secondary | ICD-10-CM | POA: Diagnosis not present

## 2019-10-06 DIAGNOSIS — Z20828 Contact with and (suspected) exposure to other viral communicable diseases: Secondary | ICD-10-CM | POA: Diagnosis not present

## 2019-10-08 DIAGNOSIS — Z20828 Contact with and (suspected) exposure to other viral communicable diseases: Secondary | ICD-10-CM | POA: Diagnosis not present

## 2019-10-10 DIAGNOSIS — J3089 Other allergic rhinitis: Secondary | ICD-10-CM | POA: Diagnosis not present

## 2019-10-10 DIAGNOSIS — J301 Allergic rhinitis due to pollen: Secondary | ICD-10-CM | POA: Diagnosis not present

## 2019-10-10 DIAGNOSIS — J3081 Allergic rhinitis due to animal (cat) (dog) hair and dander: Secondary | ICD-10-CM | POA: Diagnosis not present

## 2019-10-16 DIAGNOSIS — J301 Allergic rhinitis due to pollen: Secondary | ICD-10-CM | POA: Diagnosis not present

## 2019-10-16 DIAGNOSIS — J3081 Allergic rhinitis due to animal (cat) (dog) hair and dander: Secondary | ICD-10-CM | POA: Diagnosis not present

## 2019-10-16 DIAGNOSIS — J3089 Other allergic rhinitis: Secondary | ICD-10-CM | POA: Diagnosis not present

## 2019-10-22 DIAGNOSIS — J301 Allergic rhinitis due to pollen: Secondary | ICD-10-CM | POA: Diagnosis not present

## 2019-10-22 DIAGNOSIS — J3081 Allergic rhinitis due to animal (cat) (dog) hair and dander: Secondary | ICD-10-CM | POA: Diagnosis not present

## 2019-10-22 DIAGNOSIS — J3089 Other allergic rhinitis: Secondary | ICD-10-CM | POA: Diagnosis not present

## 2019-10-30 DIAGNOSIS — J3089 Other allergic rhinitis: Secondary | ICD-10-CM | POA: Diagnosis not present

## 2019-10-30 DIAGNOSIS — J301 Allergic rhinitis due to pollen: Secondary | ICD-10-CM | POA: Diagnosis not present

## 2019-10-30 DIAGNOSIS — J3081 Allergic rhinitis due to animal (cat) (dog) hair and dander: Secondary | ICD-10-CM | POA: Diagnosis not present

## 2019-11-06 DIAGNOSIS — J3081 Allergic rhinitis due to animal (cat) (dog) hair and dander: Secondary | ICD-10-CM | POA: Diagnosis not present

## 2019-11-06 DIAGNOSIS — J301 Allergic rhinitis due to pollen: Secondary | ICD-10-CM | POA: Diagnosis not present

## 2019-11-06 DIAGNOSIS — J3089 Other allergic rhinitis: Secondary | ICD-10-CM | POA: Diagnosis not present

## 2019-11-12 DIAGNOSIS — J301 Allergic rhinitis due to pollen: Secondary | ICD-10-CM | POA: Diagnosis not present

## 2019-11-12 DIAGNOSIS — J3081 Allergic rhinitis due to animal (cat) (dog) hair and dander: Secondary | ICD-10-CM | POA: Diagnosis not present

## 2019-11-12 DIAGNOSIS — J3089 Other allergic rhinitis: Secondary | ICD-10-CM | POA: Diagnosis not present

## 2019-11-20 DIAGNOSIS — J3089 Other allergic rhinitis: Secondary | ICD-10-CM | POA: Diagnosis not present

## 2019-11-20 DIAGNOSIS — J3081 Allergic rhinitis due to animal (cat) (dog) hair and dander: Secondary | ICD-10-CM | POA: Diagnosis not present

## 2019-11-20 DIAGNOSIS — J301 Allergic rhinitis due to pollen: Secondary | ICD-10-CM | POA: Diagnosis not present

## 2019-11-25 DIAGNOSIS — J3089 Other allergic rhinitis: Secondary | ICD-10-CM | POA: Diagnosis not present

## 2019-11-25 DIAGNOSIS — J301 Allergic rhinitis due to pollen: Secondary | ICD-10-CM | POA: Diagnosis not present

## 2019-11-25 DIAGNOSIS — J3081 Allergic rhinitis due to animal (cat) (dog) hair and dander: Secondary | ICD-10-CM | POA: Diagnosis not present

## 2019-12-04 DIAGNOSIS — J3089 Other allergic rhinitis: Secondary | ICD-10-CM | POA: Diagnosis not present

## 2019-12-04 DIAGNOSIS — J3081 Allergic rhinitis due to animal (cat) (dog) hair and dander: Secondary | ICD-10-CM | POA: Diagnosis not present

## 2019-12-04 DIAGNOSIS — J301 Allergic rhinitis due to pollen: Secondary | ICD-10-CM | POA: Diagnosis not present

## 2019-12-10 DIAGNOSIS — J301 Allergic rhinitis due to pollen: Secondary | ICD-10-CM | POA: Diagnosis not present

## 2019-12-10 DIAGNOSIS — J3081 Allergic rhinitis due to animal (cat) (dog) hair and dander: Secondary | ICD-10-CM | POA: Diagnosis not present

## 2019-12-10 DIAGNOSIS — J3089 Other allergic rhinitis: Secondary | ICD-10-CM | POA: Diagnosis not present

## 2019-12-12 DIAGNOSIS — J3081 Allergic rhinitis due to animal (cat) (dog) hair and dander: Secondary | ICD-10-CM | POA: Diagnosis not present

## 2019-12-12 DIAGNOSIS — J301 Allergic rhinitis due to pollen: Secondary | ICD-10-CM | POA: Diagnosis not present

## 2019-12-15 DIAGNOSIS — J3089 Other allergic rhinitis: Secondary | ICD-10-CM | POA: Diagnosis not present

## 2019-12-18 DIAGNOSIS — J3089 Other allergic rhinitis: Secondary | ICD-10-CM | POA: Diagnosis not present

## 2019-12-18 DIAGNOSIS — J301 Allergic rhinitis due to pollen: Secondary | ICD-10-CM | POA: Diagnosis not present

## 2019-12-18 DIAGNOSIS — J3081 Allergic rhinitis due to animal (cat) (dog) hair and dander: Secondary | ICD-10-CM | POA: Diagnosis not present

## 2019-12-25 DIAGNOSIS — J301 Allergic rhinitis due to pollen: Secondary | ICD-10-CM | POA: Diagnosis not present

## 2019-12-25 DIAGNOSIS — J3081 Allergic rhinitis due to animal (cat) (dog) hair and dander: Secondary | ICD-10-CM | POA: Diagnosis not present

## 2019-12-25 DIAGNOSIS — J3089 Other allergic rhinitis: Secondary | ICD-10-CM | POA: Diagnosis not present

## 2019-12-31 DIAGNOSIS — J301 Allergic rhinitis due to pollen: Secondary | ICD-10-CM | POA: Diagnosis not present

## 2019-12-31 DIAGNOSIS — J3089 Other allergic rhinitis: Secondary | ICD-10-CM | POA: Diagnosis not present

## 2019-12-31 DIAGNOSIS — J3081 Allergic rhinitis due to animal (cat) (dog) hair and dander: Secondary | ICD-10-CM | POA: Diagnosis not present

## 2020-01-08 DIAGNOSIS — J3089 Other allergic rhinitis: Secondary | ICD-10-CM | POA: Diagnosis not present

## 2020-01-08 DIAGNOSIS — J3081 Allergic rhinitis due to animal (cat) (dog) hair and dander: Secondary | ICD-10-CM | POA: Diagnosis not present

## 2020-01-08 DIAGNOSIS — J301 Allergic rhinitis due to pollen: Secondary | ICD-10-CM | POA: Diagnosis not present

## 2020-01-13 DIAGNOSIS — J3089 Other allergic rhinitis: Secondary | ICD-10-CM | POA: Diagnosis not present

## 2020-01-13 DIAGNOSIS — J3081 Allergic rhinitis due to animal (cat) (dog) hair and dander: Secondary | ICD-10-CM | POA: Diagnosis not present

## 2020-01-13 DIAGNOSIS — J301 Allergic rhinitis due to pollen: Secondary | ICD-10-CM | POA: Diagnosis not present

## 2020-01-16 DIAGNOSIS — J3089 Other allergic rhinitis: Secondary | ICD-10-CM | POA: Diagnosis not present

## 2020-01-16 DIAGNOSIS — J301 Allergic rhinitis due to pollen: Secondary | ICD-10-CM | POA: Diagnosis not present

## 2020-01-16 DIAGNOSIS — J3081 Allergic rhinitis due to animal (cat) (dog) hair and dander: Secondary | ICD-10-CM | POA: Diagnosis not present

## 2020-01-17 IMAGING — CT CT ABDOMEN AND PELVIS WITH CONTRAST
2 of 4 series · 17 of 46 positions shown, 19 images · IV contrast (Omni 300)
Comparison: None.

CLINICAL DATA: Acute onset abdominal pain today. Elevated white
blood cell count.

EXAM:
CT ABDOMEN AND PELVIS WITH CONTRAST
TECHNIQUE: Multidetector CT imaging of the abdomen and pelvis was performed
using the standard protocol following bolus administration of
intravenous contrast.
CONTRAST:  100 mL OMNIPAQUE IOHEXOL 300 MG/ML  SOLN

[Series 3: a/p w/ 5mm · axial · 0.94mm/px · z∈[+567,+1082]mm · 14 of 113 slices shown, 16 images]
[im 5/113  soft-tissue]
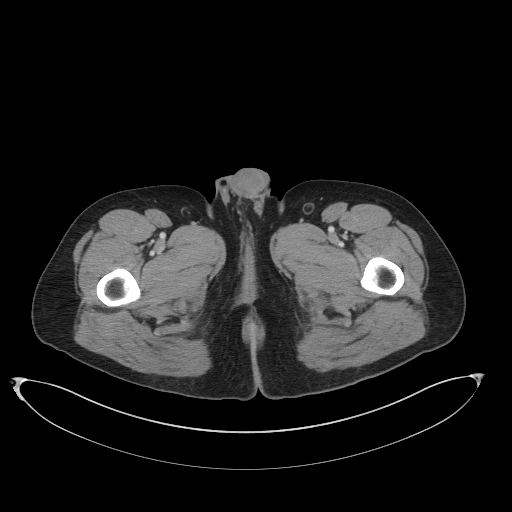
[im 5/113  bone]
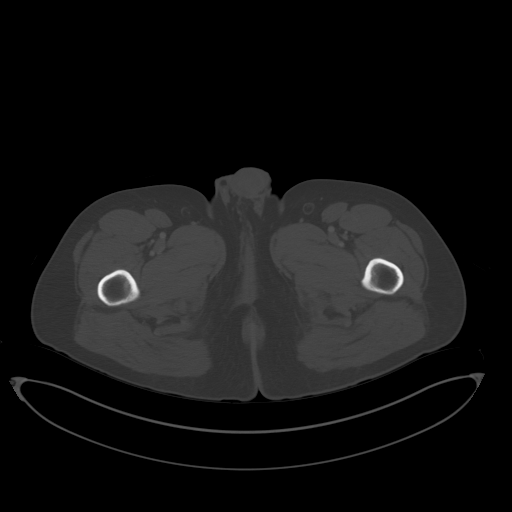
[im 15/113  soft-tissue]
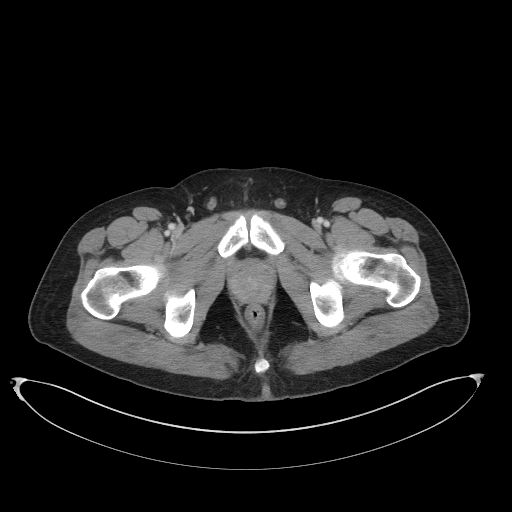
[im 20/113  soft-tissue]
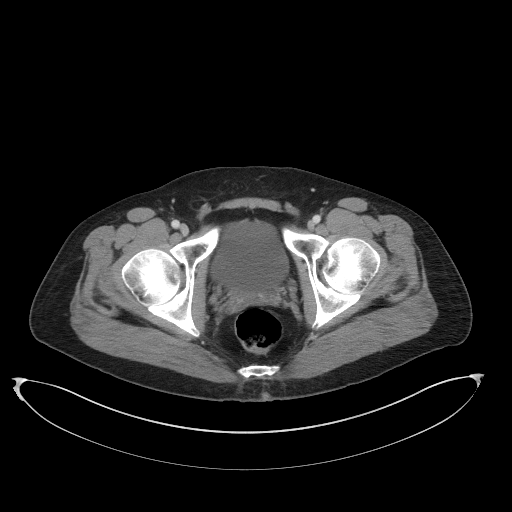
[im 30/113  soft-tissue]
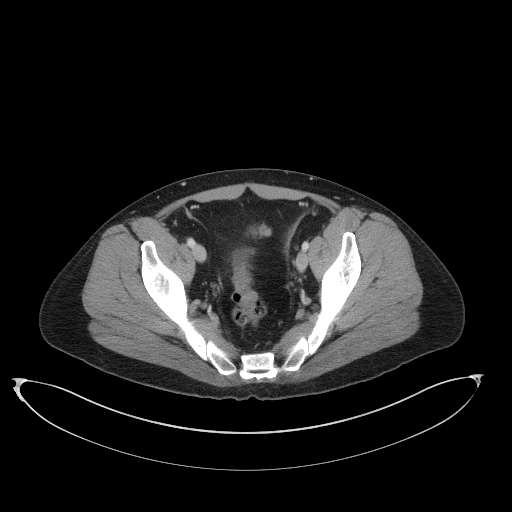
[im 39/113  soft-tissue]
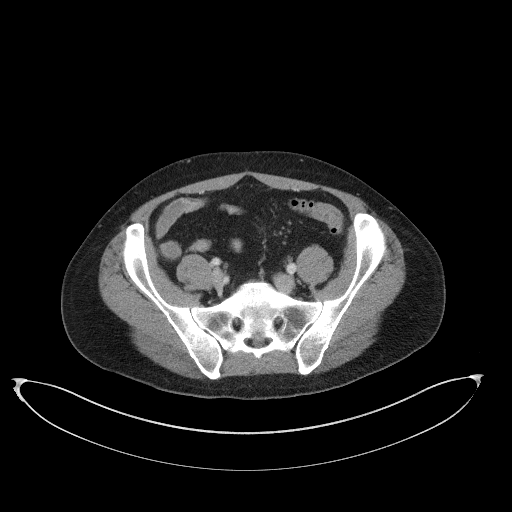
[im 44/113  soft-tissue]
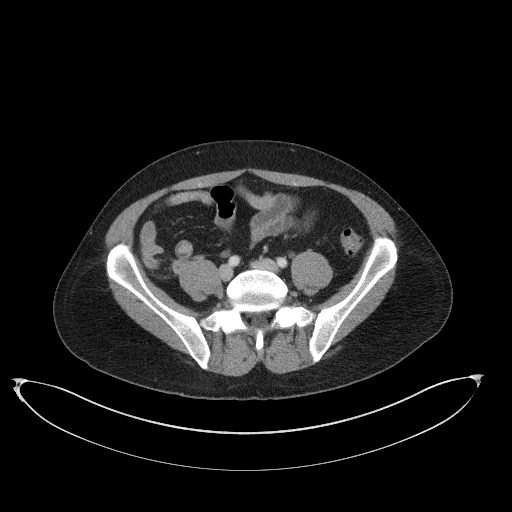
[im 54/113  soft-tissue]
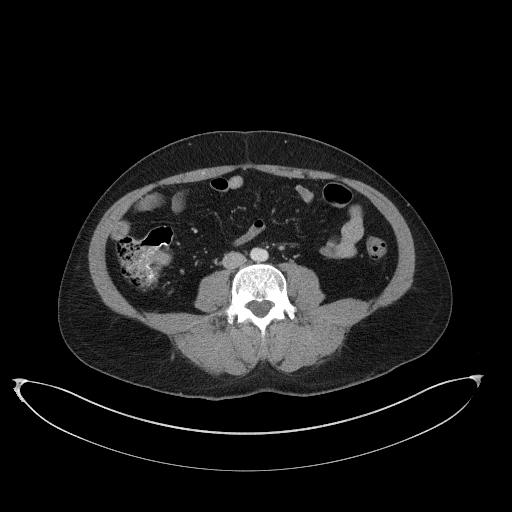
[im 59/113  soft-tissue]
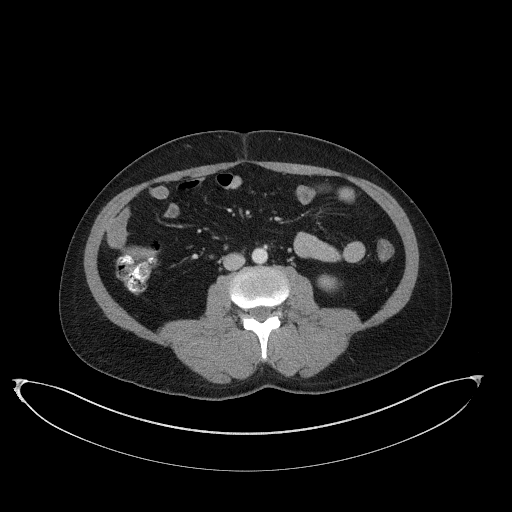
[im 69/113  soft-tissue]
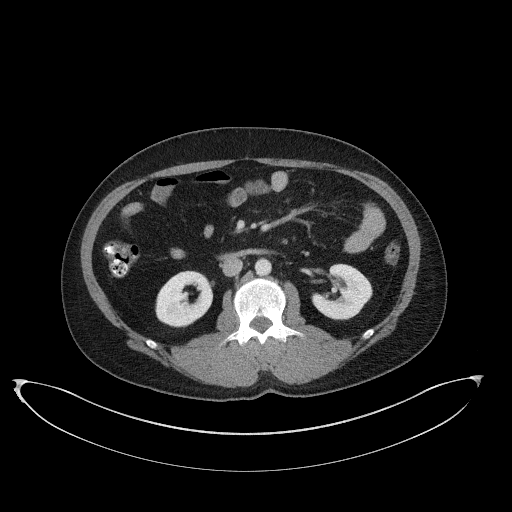
[im 69/113  bone]
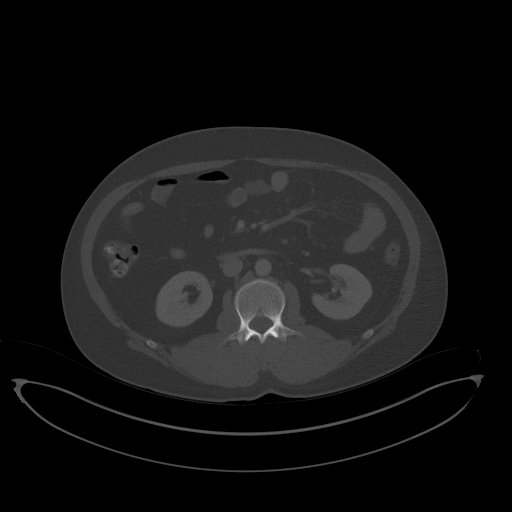
[im 74/113  soft-tissue]
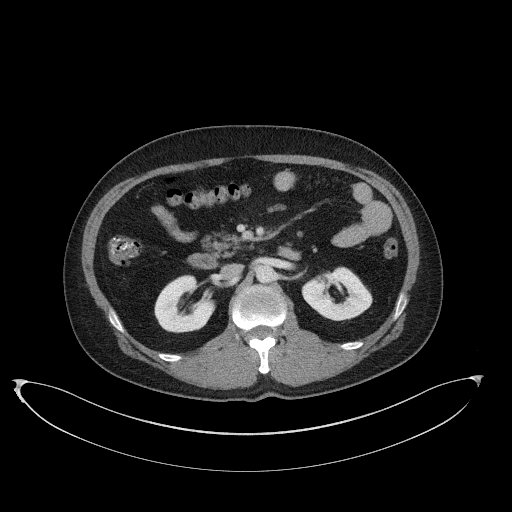
[im 83/113  soft-tissue]
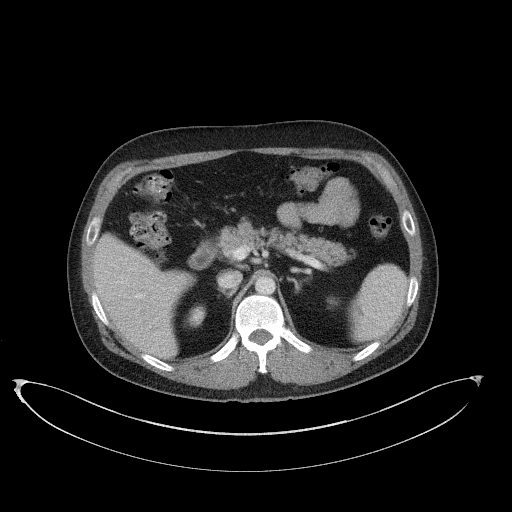
[im 93/113  soft-tissue]
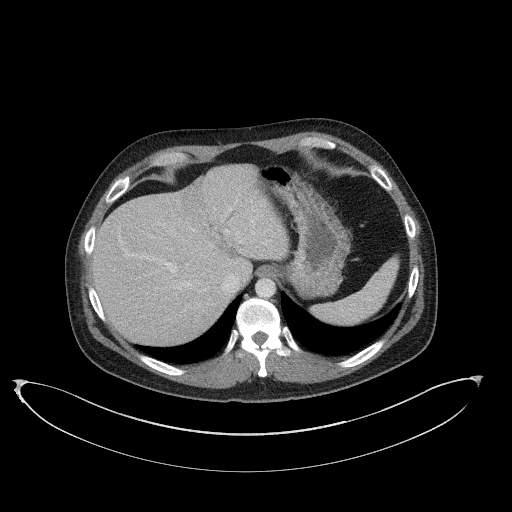
[im 98/113  soft-tissue]
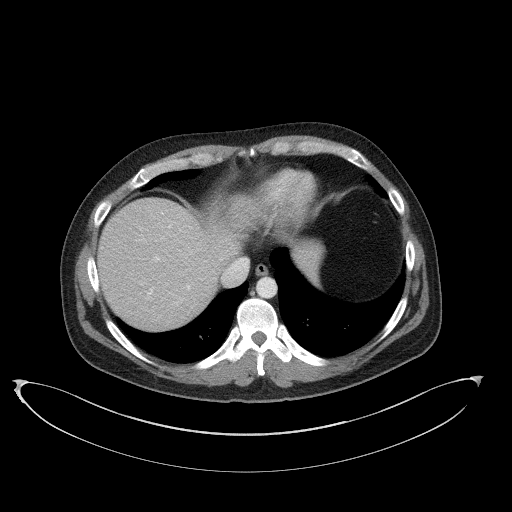
[im 108/113  soft-tissue]
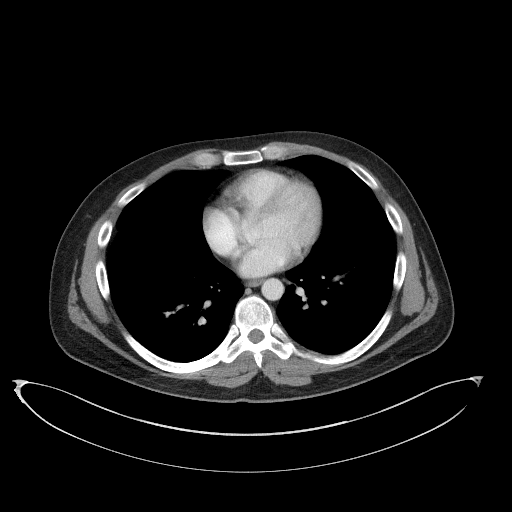

[Series 6: a/p w/ cor · coronal · 1.09mm/px · 3 of 151 slices shown]
[im 51/151  soft-tissue]
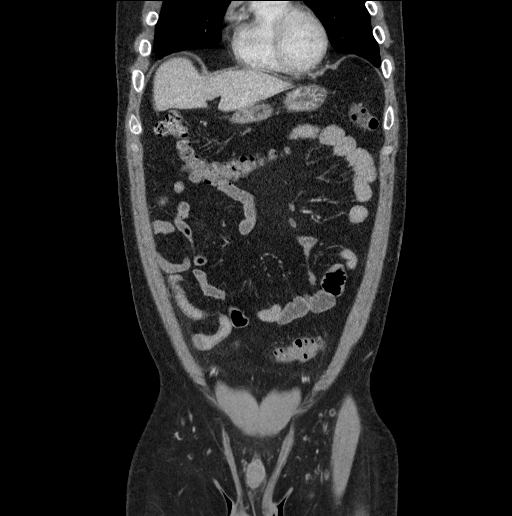
[im 67/151  soft-tissue]
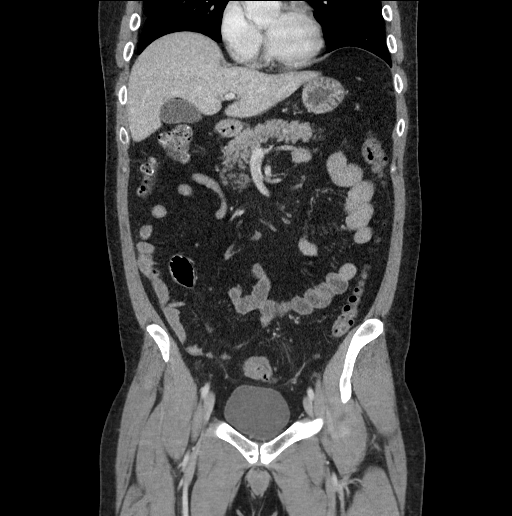
[im 84/151  soft-tissue]
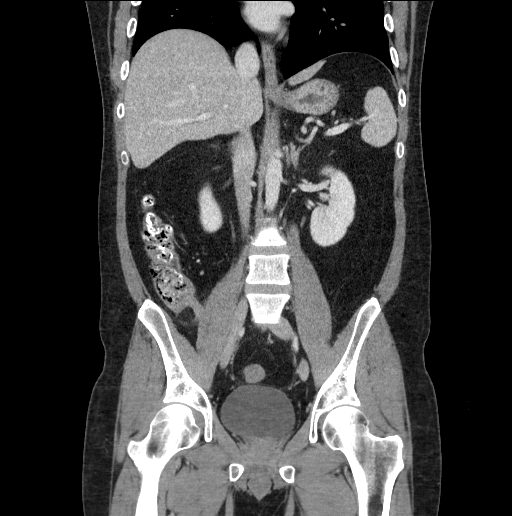

[17 of 46 positions shown; findings below may reference images not displayed]

FINDINGS: Lower chest: Lung bases clear. Heart size normal. No pleural or
pericardial effusion.

Hepatobiliary: No focal liver abnormality is seen. No gallstones,
gallbladder wall thickening, or biliary dilatation.

Pancreas: Unremarkable. No pancreatic ductal dilatation or
surrounding inflammatory changes.

Spleen: Normal in size without focal abnormality.

Adrenals/Urinary Tract: Adrenal glands are unremarkable. Kidneys are
normal, without renal calculi, focal lesion, or hydronephrosis.
Bladder is unremarkable.

Stomach/Bowel: The appendix is dilated with periappendiceal
inflammatory change consistent with acute appendicitis. No abscess
or perforation. A few sigmoid diverticula are noted but there is no
diverticulitis. The colon otherwise appears normal. Stomach and
small bowel are normal in appearance.

Vascular/Lymphatic: No significant vascular findings are present. No
enlarged abdominal or pelvic lymph nodes.

Reproductive: Prostate is unremarkable.

Other: None.

Musculoskeletal: Negative.
IMPRESSION: Acute appendicitis without abscess or perforation.

Mild sigmoid diverticulosis without diverticulitis.

These results were called by telephone at the time of interpretation
on 01/10/2019 at [DATE] to KYOUNGWOO VILAR RODRIGUES, M.D. Who verbally
acknowledged these results.

## 2020-01-22 DIAGNOSIS — J3081 Allergic rhinitis due to animal (cat) (dog) hair and dander: Secondary | ICD-10-CM | POA: Diagnosis not present

## 2020-01-22 DIAGNOSIS — J301 Allergic rhinitis due to pollen: Secondary | ICD-10-CM | POA: Diagnosis not present

## 2020-01-22 DIAGNOSIS — J3089 Other allergic rhinitis: Secondary | ICD-10-CM | POA: Diagnosis not present

## 2020-01-27 DIAGNOSIS — J3081 Allergic rhinitis due to animal (cat) (dog) hair and dander: Secondary | ICD-10-CM | POA: Diagnosis not present

## 2020-01-27 DIAGNOSIS — J3089 Other allergic rhinitis: Secondary | ICD-10-CM | POA: Diagnosis not present

## 2020-01-27 DIAGNOSIS — J301 Allergic rhinitis due to pollen: Secondary | ICD-10-CM | POA: Diagnosis not present

## 2020-02-02 DIAGNOSIS — J3081 Allergic rhinitis due to animal (cat) (dog) hair and dander: Secondary | ICD-10-CM | POA: Diagnosis not present

## 2020-02-02 DIAGNOSIS — J301 Allergic rhinitis due to pollen: Secondary | ICD-10-CM | POA: Diagnosis not present

## 2020-02-02 DIAGNOSIS — J3089 Other allergic rhinitis: Secondary | ICD-10-CM | POA: Diagnosis not present

## 2020-02-13 DIAGNOSIS — J301 Allergic rhinitis due to pollen: Secondary | ICD-10-CM | POA: Diagnosis not present

## 2020-02-13 DIAGNOSIS — J3089 Other allergic rhinitis: Secondary | ICD-10-CM | POA: Diagnosis not present

## 2020-02-13 DIAGNOSIS — J3081 Allergic rhinitis due to animal (cat) (dog) hair and dander: Secondary | ICD-10-CM | POA: Diagnosis not present

## 2020-02-24 DIAGNOSIS — J3089 Other allergic rhinitis: Secondary | ICD-10-CM | POA: Diagnosis not present

## 2020-02-24 DIAGNOSIS — J3081 Allergic rhinitis due to animal (cat) (dog) hair and dander: Secondary | ICD-10-CM | POA: Diagnosis not present

## 2020-02-24 DIAGNOSIS — J301 Allergic rhinitis due to pollen: Secondary | ICD-10-CM | POA: Diagnosis not present

## 2020-03-03 DIAGNOSIS — J301 Allergic rhinitis due to pollen: Secondary | ICD-10-CM | POA: Diagnosis not present

## 2020-03-03 DIAGNOSIS — J3081 Allergic rhinitis due to animal (cat) (dog) hair and dander: Secondary | ICD-10-CM | POA: Diagnosis not present

## 2020-03-03 DIAGNOSIS — J3089 Other allergic rhinitis: Secondary | ICD-10-CM | POA: Diagnosis not present

## 2020-03-09 DIAGNOSIS — J3081 Allergic rhinitis due to animal (cat) (dog) hair and dander: Secondary | ICD-10-CM | POA: Diagnosis not present

## 2020-03-09 DIAGNOSIS — J3089 Other allergic rhinitis: Secondary | ICD-10-CM | POA: Diagnosis not present

## 2020-03-09 DIAGNOSIS — J301 Allergic rhinitis due to pollen: Secondary | ICD-10-CM | POA: Diagnosis not present

## 2020-03-10 DIAGNOSIS — Z0000 Encounter for general adult medical examination without abnormal findings: Secondary | ICD-10-CM | POA: Diagnosis not present

## 2020-03-16 DIAGNOSIS — J3089 Other allergic rhinitis: Secondary | ICD-10-CM | POA: Diagnosis not present

## 2020-03-16 DIAGNOSIS — J3081 Allergic rhinitis due to animal (cat) (dog) hair and dander: Secondary | ICD-10-CM | POA: Diagnosis not present

## 2020-03-16 DIAGNOSIS — J301 Allergic rhinitis due to pollen: Secondary | ICD-10-CM | POA: Diagnosis not present

## 2020-03-23 DIAGNOSIS — J3089 Other allergic rhinitis: Secondary | ICD-10-CM | POA: Diagnosis not present

## 2020-03-23 DIAGNOSIS — J3081 Allergic rhinitis due to animal (cat) (dog) hair and dander: Secondary | ICD-10-CM | POA: Diagnosis not present

## 2020-03-23 DIAGNOSIS — J301 Allergic rhinitis due to pollen: Secondary | ICD-10-CM | POA: Diagnosis not present

## 2020-03-25 DIAGNOSIS — R030 Elevated blood-pressure reading, without diagnosis of hypertension: Secondary | ICD-10-CM | POA: Diagnosis not present

## 2020-03-25 DIAGNOSIS — Z0000 Encounter for general adult medical examination without abnormal findings: Secondary | ICD-10-CM | POA: Diagnosis not present

## 2020-03-30 DIAGNOSIS — J3081 Allergic rhinitis due to animal (cat) (dog) hair and dander: Secondary | ICD-10-CM | POA: Diagnosis not present

## 2020-03-30 DIAGNOSIS — J3089 Other allergic rhinitis: Secondary | ICD-10-CM | POA: Diagnosis not present

## 2020-03-30 DIAGNOSIS — J301 Allergic rhinitis due to pollen: Secondary | ICD-10-CM | POA: Diagnosis not present

## 2020-04-05 DIAGNOSIS — J3089 Other allergic rhinitis: Secondary | ICD-10-CM | POA: Diagnosis not present

## 2020-04-05 DIAGNOSIS — J301 Allergic rhinitis due to pollen: Secondary | ICD-10-CM | POA: Diagnosis not present

## 2020-04-05 DIAGNOSIS — J3081 Allergic rhinitis due to animal (cat) (dog) hair and dander: Secondary | ICD-10-CM | POA: Diagnosis not present

## 2020-04-13 DIAGNOSIS — J3081 Allergic rhinitis due to animal (cat) (dog) hair and dander: Secondary | ICD-10-CM | POA: Diagnosis not present

## 2020-04-13 DIAGNOSIS — J301 Allergic rhinitis due to pollen: Secondary | ICD-10-CM | POA: Diagnosis not present

## 2020-04-13 DIAGNOSIS — J3089 Other allergic rhinitis: Secondary | ICD-10-CM | POA: Diagnosis not present

## 2020-04-20 DIAGNOSIS — J3089 Other allergic rhinitis: Secondary | ICD-10-CM | POA: Diagnosis not present

## 2020-04-20 DIAGNOSIS — J301 Allergic rhinitis due to pollen: Secondary | ICD-10-CM | POA: Diagnosis not present

## 2020-04-20 DIAGNOSIS — J3081 Allergic rhinitis due to animal (cat) (dog) hair and dander: Secondary | ICD-10-CM | POA: Diagnosis not present

## 2020-04-29 DIAGNOSIS — J3081 Allergic rhinitis due to animal (cat) (dog) hair and dander: Secondary | ICD-10-CM | POA: Diagnosis not present

## 2020-04-29 DIAGNOSIS — J3089 Other allergic rhinitis: Secondary | ICD-10-CM | POA: Diagnosis not present

## 2020-04-29 DIAGNOSIS — J301 Allergic rhinitis due to pollen: Secondary | ICD-10-CM | POA: Diagnosis not present

## 2020-05-04 DIAGNOSIS — J3081 Allergic rhinitis due to animal (cat) (dog) hair and dander: Secondary | ICD-10-CM | POA: Diagnosis not present

## 2020-05-04 DIAGNOSIS — J3089 Other allergic rhinitis: Secondary | ICD-10-CM | POA: Diagnosis not present

## 2020-05-04 DIAGNOSIS — J301 Allergic rhinitis due to pollen: Secondary | ICD-10-CM | POA: Diagnosis not present

## 2020-05-11 DIAGNOSIS — J301 Allergic rhinitis due to pollen: Secondary | ICD-10-CM | POA: Diagnosis not present

## 2020-05-11 DIAGNOSIS — J3081 Allergic rhinitis due to animal (cat) (dog) hair and dander: Secondary | ICD-10-CM | POA: Diagnosis not present

## 2020-05-11 DIAGNOSIS — J3089 Other allergic rhinitis: Secondary | ICD-10-CM | POA: Diagnosis not present

## 2020-05-13 DIAGNOSIS — J3081 Allergic rhinitis due to animal (cat) (dog) hair and dander: Secondary | ICD-10-CM | POA: Diagnosis not present

## 2020-05-13 DIAGNOSIS — J3089 Other allergic rhinitis: Secondary | ICD-10-CM | POA: Diagnosis not present

## 2020-05-13 DIAGNOSIS — J301 Allergic rhinitis due to pollen: Secondary | ICD-10-CM | POA: Diagnosis not present

## 2020-05-18 DIAGNOSIS — J301 Allergic rhinitis due to pollen: Secondary | ICD-10-CM | POA: Diagnosis not present

## 2020-05-18 DIAGNOSIS — J3089 Other allergic rhinitis: Secondary | ICD-10-CM | POA: Diagnosis not present

## 2020-05-18 DIAGNOSIS — J3081 Allergic rhinitis due to animal (cat) (dog) hair and dander: Secondary | ICD-10-CM | POA: Diagnosis not present

## 2020-05-25 DIAGNOSIS — J3081 Allergic rhinitis due to animal (cat) (dog) hair and dander: Secondary | ICD-10-CM | POA: Diagnosis not present

## 2020-05-25 DIAGNOSIS — J3089 Other allergic rhinitis: Secondary | ICD-10-CM | POA: Diagnosis not present

## 2020-05-25 DIAGNOSIS — J301 Allergic rhinitis due to pollen: Secondary | ICD-10-CM | POA: Diagnosis not present

## 2020-05-31 DIAGNOSIS — J3089 Other allergic rhinitis: Secondary | ICD-10-CM | POA: Diagnosis not present

## 2020-05-31 DIAGNOSIS — J3081 Allergic rhinitis due to animal (cat) (dog) hair and dander: Secondary | ICD-10-CM | POA: Diagnosis not present

## 2020-05-31 DIAGNOSIS — J301 Allergic rhinitis due to pollen: Secondary | ICD-10-CM | POA: Diagnosis not present

## 2020-06-08 DIAGNOSIS — J3089 Other allergic rhinitis: Secondary | ICD-10-CM | POA: Diagnosis not present

## 2020-06-08 DIAGNOSIS — J3081 Allergic rhinitis due to animal (cat) (dog) hair and dander: Secondary | ICD-10-CM | POA: Diagnosis not present

## 2020-06-08 DIAGNOSIS — J301 Allergic rhinitis due to pollen: Secondary | ICD-10-CM | POA: Diagnosis not present

## 2020-06-14 DIAGNOSIS — H1045 Other chronic allergic conjunctivitis: Secondary | ICD-10-CM | POA: Diagnosis not present

## 2020-06-14 DIAGNOSIS — J301 Allergic rhinitis due to pollen: Secondary | ICD-10-CM | POA: Diagnosis not present

## 2020-06-14 DIAGNOSIS — J3089 Other allergic rhinitis: Secondary | ICD-10-CM | POA: Diagnosis not present

## 2020-06-14 DIAGNOSIS — J3081 Allergic rhinitis due to animal (cat) (dog) hair and dander: Secondary | ICD-10-CM | POA: Diagnosis not present

## 2020-06-22 DIAGNOSIS — J3089 Other allergic rhinitis: Secondary | ICD-10-CM | POA: Diagnosis not present

## 2020-06-22 DIAGNOSIS — J301 Allergic rhinitis due to pollen: Secondary | ICD-10-CM | POA: Diagnosis not present

## 2020-06-22 DIAGNOSIS — J3081 Allergic rhinitis due to animal (cat) (dog) hair and dander: Secondary | ICD-10-CM | POA: Diagnosis not present

## 2020-06-29 DIAGNOSIS — J301 Allergic rhinitis due to pollen: Secondary | ICD-10-CM | POA: Diagnosis not present

## 2020-06-29 DIAGNOSIS — J3089 Other allergic rhinitis: Secondary | ICD-10-CM | POA: Diagnosis not present

## 2020-06-29 DIAGNOSIS — J3081 Allergic rhinitis due to animal (cat) (dog) hair and dander: Secondary | ICD-10-CM | POA: Diagnosis not present

## 2020-07-06 DIAGNOSIS — J3081 Allergic rhinitis due to animal (cat) (dog) hair and dander: Secondary | ICD-10-CM | POA: Diagnosis not present

## 2020-07-06 DIAGNOSIS — J301 Allergic rhinitis due to pollen: Secondary | ICD-10-CM | POA: Diagnosis not present

## 2020-07-06 DIAGNOSIS — J3089 Other allergic rhinitis: Secondary | ICD-10-CM | POA: Diagnosis not present

## 2020-07-13 DIAGNOSIS — J301 Allergic rhinitis due to pollen: Secondary | ICD-10-CM | POA: Diagnosis not present

## 2020-07-13 DIAGNOSIS — J3089 Other allergic rhinitis: Secondary | ICD-10-CM | POA: Diagnosis not present

## 2020-07-13 DIAGNOSIS — J3081 Allergic rhinitis due to animal (cat) (dog) hair and dander: Secondary | ICD-10-CM | POA: Diagnosis not present

## 2020-07-20 DIAGNOSIS — J3089 Other allergic rhinitis: Secondary | ICD-10-CM | POA: Diagnosis not present

## 2020-07-20 DIAGNOSIS — J3081 Allergic rhinitis due to animal (cat) (dog) hair and dander: Secondary | ICD-10-CM | POA: Diagnosis not present

## 2020-07-20 DIAGNOSIS — J301 Allergic rhinitis due to pollen: Secondary | ICD-10-CM | POA: Diagnosis not present

## 2020-07-27 DIAGNOSIS — J301 Allergic rhinitis due to pollen: Secondary | ICD-10-CM | POA: Diagnosis not present

## 2020-07-27 DIAGNOSIS — J3081 Allergic rhinitis due to animal (cat) (dog) hair and dander: Secondary | ICD-10-CM | POA: Diagnosis not present

## 2020-07-27 DIAGNOSIS — J3089 Other allergic rhinitis: Secondary | ICD-10-CM | POA: Diagnosis not present

## 2020-08-03 DIAGNOSIS — J3081 Allergic rhinitis due to animal (cat) (dog) hair and dander: Secondary | ICD-10-CM | POA: Diagnosis not present

## 2020-08-03 DIAGNOSIS — J3089 Other allergic rhinitis: Secondary | ICD-10-CM | POA: Diagnosis not present

## 2020-08-03 DIAGNOSIS — J301 Allergic rhinitis due to pollen: Secondary | ICD-10-CM | POA: Diagnosis not present

## 2020-08-12 DIAGNOSIS — J3081 Allergic rhinitis due to animal (cat) (dog) hair and dander: Secondary | ICD-10-CM | POA: Diagnosis not present

## 2020-08-12 DIAGNOSIS — J301 Allergic rhinitis due to pollen: Secondary | ICD-10-CM | POA: Diagnosis not present

## 2020-08-12 DIAGNOSIS — J3089 Other allergic rhinitis: Secondary | ICD-10-CM | POA: Diagnosis not present

## 2020-08-17 DIAGNOSIS — J3089 Other allergic rhinitis: Secondary | ICD-10-CM | POA: Diagnosis not present

## 2020-08-17 DIAGNOSIS — J301 Allergic rhinitis due to pollen: Secondary | ICD-10-CM | POA: Diagnosis not present

## 2020-08-17 DIAGNOSIS — J3081 Allergic rhinitis due to animal (cat) (dog) hair and dander: Secondary | ICD-10-CM | POA: Diagnosis not present

## 2020-08-18 DIAGNOSIS — J3089 Other allergic rhinitis: Secondary | ICD-10-CM | POA: Diagnosis not present

## 2020-08-24 DIAGNOSIS — J3081 Allergic rhinitis due to animal (cat) (dog) hair and dander: Secondary | ICD-10-CM | POA: Diagnosis not present

## 2020-08-24 DIAGNOSIS — J3089 Other allergic rhinitis: Secondary | ICD-10-CM | POA: Diagnosis not present

## 2020-08-24 DIAGNOSIS — J301 Allergic rhinitis due to pollen: Secondary | ICD-10-CM | POA: Diagnosis not present

## 2020-08-31 DIAGNOSIS — J3081 Allergic rhinitis due to animal (cat) (dog) hair and dander: Secondary | ICD-10-CM | POA: Diagnosis not present

## 2020-08-31 DIAGNOSIS — J301 Allergic rhinitis due to pollen: Secondary | ICD-10-CM | POA: Diagnosis not present

## 2020-08-31 DIAGNOSIS — J3089 Other allergic rhinitis: Secondary | ICD-10-CM | POA: Diagnosis not present

## 2020-09-07 DIAGNOSIS — J3089 Other allergic rhinitis: Secondary | ICD-10-CM | POA: Diagnosis not present

## 2020-09-07 DIAGNOSIS — J301 Allergic rhinitis due to pollen: Secondary | ICD-10-CM | POA: Diagnosis not present

## 2020-09-07 DIAGNOSIS — J3081 Allergic rhinitis due to animal (cat) (dog) hair and dander: Secondary | ICD-10-CM | POA: Diagnosis not present

## 2020-09-14 DIAGNOSIS — J3089 Other allergic rhinitis: Secondary | ICD-10-CM | POA: Diagnosis not present

## 2020-09-14 DIAGNOSIS — J3081 Allergic rhinitis due to animal (cat) (dog) hair and dander: Secondary | ICD-10-CM | POA: Diagnosis not present

## 2020-09-14 DIAGNOSIS — J301 Allergic rhinitis due to pollen: Secondary | ICD-10-CM | POA: Diagnosis not present

## 2020-09-21 DIAGNOSIS — J301 Allergic rhinitis due to pollen: Secondary | ICD-10-CM | POA: Diagnosis not present

## 2020-09-21 DIAGNOSIS — J3089 Other allergic rhinitis: Secondary | ICD-10-CM | POA: Diagnosis not present

## 2020-09-21 DIAGNOSIS — J3081 Allergic rhinitis due to animal (cat) (dog) hair and dander: Secondary | ICD-10-CM | POA: Diagnosis not present

## 2020-09-28 DIAGNOSIS — J3081 Allergic rhinitis due to animal (cat) (dog) hair and dander: Secondary | ICD-10-CM | POA: Diagnosis not present

## 2020-09-28 DIAGNOSIS — J3089 Other allergic rhinitis: Secondary | ICD-10-CM | POA: Diagnosis not present

## 2020-09-28 DIAGNOSIS — J301 Allergic rhinitis due to pollen: Secondary | ICD-10-CM | POA: Diagnosis not present

## 2020-10-12 DIAGNOSIS — J301 Allergic rhinitis due to pollen: Secondary | ICD-10-CM | POA: Diagnosis not present

## 2020-10-12 DIAGNOSIS — J3089 Other allergic rhinitis: Secondary | ICD-10-CM | POA: Diagnosis not present

## 2020-10-12 DIAGNOSIS — J3081 Allergic rhinitis due to animal (cat) (dog) hair and dander: Secondary | ICD-10-CM | POA: Diagnosis not present

## 2020-10-21 DIAGNOSIS — H1045 Other chronic allergic conjunctivitis: Secondary | ICD-10-CM | POA: Diagnosis not present

## 2020-10-21 DIAGNOSIS — J3089 Other allergic rhinitis: Secondary | ICD-10-CM | POA: Diagnosis not present

## 2020-10-21 DIAGNOSIS — J3081 Allergic rhinitis due to animal (cat) (dog) hair and dander: Secondary | ICD-10-CM | POA: Diagnosis not present

## 2020-10-21 DIAGNOSIS — J301 Allergic rhinitis due to pollen: Secondary | ICD-10-CM | POA: Diagnosis not present

## 2020-10-26 DIAGNOSIS — J301 Allergic rhinitis due to pollen: Secondary | ICD-10-CM | POA: Diagnosis not present

## 2020-10-26 DIAGNOSIS — J3089 Other allergic rhinitis: Secondary | ICD-10-CM | POA: Diagnosis not present

## 2020-10-26 DIAGNOSIS — J3081 Allergic rhinitis due to animal (cat) (dog) hair and dander: Secondary | ICD-10-CM | POA: Diagnosis not present

## 2020-11-02 DIAGNOSIS — J3089 Other allergic rhinitis: Secondary | ICD-10-CM | POA: Diagnosis not present

## 2020-11-02 DIAGNOSIS — J3081 Allergic rhinitis due to animal (cat) (dog) hair and dander: Secondary | ICD-10-CM | POA: Diagnosis not present

## 2020-11-02 DIAGNOSIS — J301 Allergic rhinitis due to pollen: Secondary | ICD-10-CM | POA: Diagnosis not present

## 2020-11-09 DIAGNOSIS — J3081 Allergic rhinitis due to animal (cat) (dog) hair and dander: Secondary | ICD-10-CM | POA: Diagnosis not present

## 2020-11-09 DIAGNOSIS — J3089 Other allergic rhinitis: Secondary | ICD-10-CM | POA: Diagnosis not present

## 2020-11-09 DIAGNOSIS — J301 Allergic rhinitis due to pollen: Secondary | ICD-10-CM | POA: Diagnosis not present

## 2020-11-16 DIAGNOSIS — J3081 Allergic rhinitis due to animal (cat) (dog) hair and dander: Secondary | ICD-10-CM | POA: Diagnosis not present

## 2020-11-16 DIAGNOSIS — J3089 Other allergic rhinitis: Secondary | ICD-10-CM | POA: Diagnosis not present

## 2020-11-16 DIAGNOSIS — J301 Allergic rhinitis due to pollen: Secondary | ICD-10-CM | POA: Diagnosis not present

## 2020-11-22 DIAGNOSIS — J301 Allergic rhinitis due to pollen: Secondary | ICD-10-CM | POA: Diagnosis not present

## 2020-11-22 DIAGNOSIS — J3081 Allergic rhinitis due to animal (cat) (dog) hair and dander: Secondary | ICD-10-CM | POA: Diagnosis not present

## 2020-11-22 DIAGNOSIS — J3089 Other allergic rhinitis: Secondary | ICD-10-CM | POA: Diagnosis not present

## 2020-11-30 DIAGNOSIS — J3089 Other allergic rhinitis: Secondary | ICD-10-CM | POA: Diagnosis not present

## 2020-11-30 DIAGNOSIS — J3081 Allergic rhinitis due to animal (cat) (dog) hair and dander: Secondary | ICD-10-CM | POA: Diagnosis not present

## 2020-11-30 DIAGNOSIS — J301 Allergic rhinitis due to pollen: Secondary | ICD-10-CM | POA: Diagnosis not present

## 2020-12-07 DIAGNOSIS — J301 Allergic rhinitis due to pollen: Secondary | ICD-10-CM | POA: Diagnosis not present

## 2020-12-07 DIAGNOSIS — J3081 Allergic rhinitis due to animal (cat) (dog) hair and dander: Secondary | ICD-10-CM | POA: Diagnosis not present

## 2020-12-07 DIAGNOSIS — J3089 Other allergic rhinitis: Secondary | ICD-10-CM | POA: Diagnosis not present

## 2020-12-14 DIAGNOSIS — J3089 Other allergic rhinitis: Secondary | ICD-10-CM | POA: Diagnosis not present

## 2020-12-14 DIAGNOSIS — J301 Allergic rhinitis due to pollen: Secondary | ICD-10-CM | POA: Diagnosis not present

## 2020-12-14 DIAGNOSIS — J3081 Allergic rhinitis due to animal (cat) (dog) hair and dander: Secondary | ICD-10-CM | POA: Diagnosis not present

## 2020-12-17 DIAGNOSIS — J301 Allergic rhinitis due to pollen: Secondary | ICD-10-CM | POA: Diagnosis not present

## 2020-12-17 DIAGNOSIS — J3081 Allergic rhinitis due to animal (cat) (dog) hair and dander: Secondary | ICD-10-CM | POA: Diagnosis not present

## 2020-12-21 DIAGNOSIS — J3081 Allergic rhinitis due to animal (cat) (dog) hair and dander: Secondary | ICD-10-CM | POA: Diagnosis not present

## 2020-12-21 DIAGNOSIS — J3089 Other allergic rhinitis: Secondary | ICD-10-CM | POA: Diagnosis not present

## 2020-12-21 DIAGNOSIS — J301 Allergic rhinitis due to pollen: Secondary | ICD-10-CM | POA: Diagnosis not present

## 2020-12-28 DIAGNOSIS — J3081 Allergic rhinitis due to animal (cat) (dog) hair and dander: Secondary | ICD-10-CM | POA: Diagnosis not present

## 2020-12-28 DIAGNOSIS — J301 Allergic rhinitis due to pollen: Secondary | ICD-10-CM | POA: Diagnosis not present

## 2020-12-28 DIAGNOSIS — J3089 Other allergic rhinitis: Secondary | ICD-10-CM | POA: Diagnosis not present

## 2021-01-04 DIAGNOSIS — J3089 Other allergic rhinitis: Secondary | ICD-10-CM | POA: Diagnosis not present

## 2021-01-04 DIAGNOSIS — J301 Allergic rhinitis due to pollen: Secondary | ICD-10-CM | POA: Diagnosis not present

## 2021-01-04 DIAGNOSIS — J3081 Allergic rhinitis due to animal (cat) (dog) hair and dander: Secondary | ICD-10-CM | POA: Diagnosis not present

## 2021-01-11 DIAGNOSIS — J3081 Allergic rhinitis due to animal (cat) (dog) hair and dander: Secondary | ICD-10-CM | POA: Diagnosis not present

## 2021-01-11 DIAGNOSIS — J301 Allergic rhinitis due to pollen: Secondary | ICD-10-CM | POA: Diagnosis not present

## 2021-01-11 DIAGNOSIS — J3089 Other allergic rhinitis: Secondary | ICD-10-CM | POA: Diagnosis not present

## 2021-01-18 DIAGNOSIS — J301 Allergic rhinitis due to pollen: Secondary | ICD-10-CM | POA: Diagnosis not present

## 2021-01-18 DIAGNOSIS — J3081 Allergic rhinitis due to animal (cat) (dog) hair and dander: Secondary | ICD-10-CM | POA: Diagnosis not present

## 2021-01-18 DIAGNOSIS — J3089 Other allergic rhinitis: Secondary | ICD-10-CM | POA: Diagnosis not present

## 2021-01-25 DIAGNOSIS — J301 Allergic rhinitis due to pollen: Secondary | ICD-10-CM | POA: Diagnosis not present

## 2021-01-25 DIAGNOSIS — J3081 Allergic rhinitis due to animal (cat) (dog) hair and dander: Secondary | ICD-10-CM | POA: Diagnosis not present

## 2021-01-25 DIAGNOSIS — J3089 Other allergic rhinitis: Secondary | ICD-10-CM | POA: Diagnosis not present

## 2021-02-01 DIAGNOSIS — J3089 Other allergic rhinitis: Secondary | ICD-10-CM | POA: Diagnosis not present

## 2021-02-01 DIAGNOSIS — J301 Allergic rhinitis due to pollen: Secondary | ICD-10-CM | POA: Diagnosis not present

## 2021-02-01 DIAGNOSIS — J3081 Allergic rhinitis due to animal (cat) (dog) hair and dander: Secondary | ICD-10-CM | POA: Diagnosis not present

## 2021-02-08 DIAGNOSIS — J3081 Allergic rhinitis due to animal (cat) (dog) hair and dander: Secondary | ICD-10-CM | POA: Diagnosis not present

## 2021-02-08 DIAGNOSIS — J301 Allergic rhinitis due to pollen: Secondary | ICD-10-CM | POA: Diagnosis not present

## 2021-02-08 DIAGNOSIS — J3089 Other allergic rhinitis: Secondary | ICD-10-CM | POA: Diagnosis not present

## 2021-02-15 DIAGNOSIS — J301 Allergic rhinitis due to pollen: Secondary | ICD-10-CM | POA: Diagnosis not present

## 2021-02-15 DIAGNOSIS — J3081 Allergic rhinitis due to animal (cat) (dog) hair and dander: Secondary | ICD-10-CM | POA: Diagnosis not present

## 2021-02-15 DIAGNOSIS — J3089 Other allergic rhinitis: Secondary | ICD-10-CM | POA: Diagnosis not present

## 2021-02-22 DIAGNOSIS — J3081 Allergic rhinitis due to animal (cat) (dog) hair and dander: Secondary | ICD-10-CM | POA: Diagnosis not present

## 2021-02-22 DIAGNOSIS — J3089 Other allergic rhinitis: Secondary | ICD-10-CM | POA: Diagnosis not present

## 2021-02-22 DIAGNOSIS — J301 Allergic rhinitis due to pollen: Secondary | ICD-10-CM | POA: Diagnosis not present

## 2021-03-01 DIAGNOSIS — J3089 Other allergic rhinitis: Secondary | ICD-10-CM | POA: Diagnosis not present

## 2021-03-01 DIAGNOSIS — J301 Allergic rhinitis due to pollen: Secondary | ICD-10-CM | POA: Diagnosis not present

## 2021-03-01 DIAGNOSIS — J3081 Allergic rhinitis due to animal (cat) (dog) hair and dander: Secondary | ICD-10-CM | POA: Diagnosis not present

## 2021-03-08 DIAGNOSIS — J3089 Other allergic rhinitis: Secondary | ICD-10-CM | POA: Diagnosis not present

## 2021-03-08 DIAGNOSIS — J3081 Allergic rhinitis due to animal (cat) (dog) hair and dander: Secondary | ICD-10-CM | POA: Diagnosis not present

## 2021-03-08 DIAGNOSIS — J301 Allergic rhinitis due to pollen: Secondary | ICD-10-CM | POA: Diagnosis not present

## 2021-03-15 DIAGNOSIS — J3089 Other allergic rhinitis: Secondary | ICD-10-CM | POA: Diagnosis not present

## 2021-03-15 DIAGNOSIS — J301 Allergic rhinitis due to pollen: Secondary | ICD-10-CM | POA: Diagnosis not present

## 2021-03-15 DIAGNOSIS — J3081 Allergic rhinitis due to animal (cat) (dog) hair and dander: Secondary | ICD-10-CM | POA: Diagnosis not present

## 2021-03-22 DIAGNOSIS — J3089 Other allergic rhinitis: Secondary | ICD-10-CM | POA: Diagnosis not present

## 2021-03-22 DIAGNOSIS — J3081 Allergic rhinitis due to animal (cat) (dog) hair and dander: Secondary | ICD-10-CM | POA: Diagnosis not present

## 2021-03-22 DIAGNOSIS — J301 Allergic rhinitis due to pollen: Secondary | ICD-10-CM | POA: Diagnosis not present

## 2021-03-29 DIAGNOSIS — J301 Allergic rhinitis due to pollen: Secondary | ICD-10-CM | POA: Diagnosis not present

## 2021-03-29 DIAGNOSIS — J3081 Allergic rhinitis due to animal (cat) (dog) hair and dander: Secondary | ICD-10-CM | POA: Diagnosis not present

## 2021-03-29 DIAGNOSIS — J3089 Other allergic rhinitis: Secondary | ICD-10-CM | POA: Diagnosis not present

## 2021-03-31 DIAGNOSIS — Z0000 Encounter for general adult medical examination without abnormal findings: Secondary | ICD-10-CM | POA: Diagnosis not present

## 2021-03-31 DIAGNOSIS — Z79899 Other long term (current) drug therapy: Secondary | ICD-10-CM | POA: Diagnosis not present

## 2021-04-05 DIAGNOSIS — J3089 Other allergic rhinitis: Secondary | ICD-10-CM | POA: Diagnosis not present

## 2021-04-05 DIAGNOSIS — J3081 Allergic rhinitis due to animal (cat) (dog) hair and dander: Secondary | ICD-10-CM | POA: Diagnosis not present

## 2021-04-05 DIAGNOSIS — J301 Allergic rhinitis due to pollen: Secondary | ICD-10-CM | POA: Diagnosis not present

## 2021-04-11 DIAGNOSIS — J3081 Allergic rhinitis due to animal (cat) (dog) hair and dander: Secondary | ICD-10-CM | POA: Diagnosis not present

## 2021-04-11 DIAGNOSIS — J301 Allergic rhinitis due to pollen: Secondary | ICD-10-CM | POA: Diagnosis not present

## 2021-04-11 DIAGNOSIS — J3089 Other allergic rhinitis: Secondary | ICD-10-CM | POA: Diagnosis not present

## 2021-04-18 DIAGNOSIS — J301 Allergic rhinitis due to pollen: Secondary | ICD-10-CM | POA: Diagnosis not present

## 2021-04-18 DIAGNOSIS — J3089 Other allergic rhinitis: Secondary | ICD-10-CM | POA: Diagnosis not present

## 2021-04-18 DIAGNOSIS — J3081 Allergic rhinitis due to animal (cat) (dog) hair and dander: Secondary | ICD-10-CM | POA: Diagnosis not present

## 2021-04-20 DIAGNOSIS — Z0000 Encounter for general adult medical examination without abnormal findings: Secondary | ICD-10-CM | POA: Diagnosis not present

## 2021-04-20 DIAGNOSIS — R030 Elevated blood-pressure reading, without diagnosis of hypertension: Secondary | ICD-10-CM | POA: Diagnosis not present

## 2021-04-20 DIAGNOSIS — Z1331 Encounter for screening for depression: Secondary | ICD-10-CM | POA: Diagnosis not present

## 2021-04-20 DIAGNOSIS — Z1339 Encounter for screening examination for other mental health and behavioral disorders: Secondary | ICD-10-CM | POA: Diagnosis not present

## 2021-04-21 DIAGNOSIS — R82998 Other abnormal findings in urine: Secondary | ICD-10-CM | POA: Diagnosis not present

## 2021-04-25 DIAGNOSIS — J3089 Other allergic rhinitis: Secondary | ICD-10-CM | POA: Diagnosis not present

## 2021-04-25 DIAGNOSIS — J3081 Allergic rhinitis due to animal (cat) (dog) hair and dander: Secondary | ICD-10-CM | POA: Diagnosis not present

## 2021-04-25 DIAGNOSIS — J301 Allergic rhinitis due to pollen: Secondary | ICD-10-CM | POA: Diagnosis not present

## 2021-05-02 DIAGNOSIS — J301 Allergic rhinitis due to pollen: Secondary | ICD-10-CM | POA: Diagnosis not present

## 2021-05-02 DIAGNOSIS — J3089 Other allergic rhinitis: Secondary | ICD-10-CM | POA: Diagnosis not present

## 2021-05-02 DIAGNOSIS — J3081 Allergic rhinitis due to animal (cat) (dog) hair and dander: Secondary | ICD-10-CM | POA: Diagnosis not present

## 2021-05-09 DIAGNOSIS — J301 Allergic rhinitis due to pollen: Secondary | ICD-10-CM | POA: Diagnosis not present

## 2021-05-09 DIAGNOSIS — J3081 Allergic rhinitis due to animal (cat) (dog) hair and dander: Secondary | ICD-10-CM | POA: Diagnosis not present

## 2021-05-09 DIAGNOSIS — J3089 Other allergic rhinitis: Secondary | ICD-10-CM | POA: Diagnosis not present

## 2021-05-17 DIAGNOSIS — J3081 Allergic rhinitis due to animal (cat) (dog) hair and dander: Secondary | ICD-10-CM | POA: Diagnosis not present

## 2021-05-17 DIAGNOSIS — J3089 Other allergic rhinitis: Secondary | ICD-10-CM | POA: Diagnosis not present

## 2021-05-17 DIAGNOSIS — J301 Allergic rhinitis due to pollen: Secondary | ICD-10-CM | POA: Diagnosis not present

## 2021-05-24 DIAGNOSIS — J3081 Allergic rhinitis due to animal (cat) (dog) hair and dander: Secondary | ICD-10-CM | POA: Diagnosis not present

## 2021-05-24 DIAGNOSIS — J3089 Other allergic rhinitis: Secondary | ICD-10-CM | POA: Diagnosis not present

## 2021-05-24 DIAGNOSIS — J301 Allergic rhinitis due to pollen: Secondary | ICD-10-CM | POA: Diagnosis not present

## 2021-05-31 DIAGNOSIS — J301 Allergic rhinitis due to pollen: Secondary | ICD-10-CM | POA: Diagnosis not present

## 2021-05-31 DIAGNOSIS — J3081 Allergic rhinitis due to animal (cat) (dog) hair and dander: Secondary | ICD-10-CM | POA: Diagnosis not present

## 2021-05-31 DIAGNOSIS — J3089 Other allergic rhinitis: Secondary | ICD-10-CM | POA: Diagnosis not present

## 2021-06-07 DIAGNOSIS — J301 Allergic rhinitis due to pollen: Secondary | ICD-10-CM | POA: Diagnosis not present

## 2021-06-07 DIAGNOSIS — J3089 Other allergic rhinitis: Secondary | ICD-10-CM | POA: Diagnosis not present

## 2021-06-07 DIAGNOSIS — J3081 Allergic rhinitis due to animal (cat) (dog) hair and dander: Secondary | ICD-10-CM | POA: Diagnosis not present

## 2021-06-14 DIAGNOSIS — J301 Allergic rhinitis due to pollen: Secondary | ICD-10-CM | POA: Diagnosis not present

## 2021-06-14 DIAGNOSIS — J3081 Allergic rhinitis due to animal (cat) (dog) hair and dander: Secondary | ICD-10-CM | POA: Diagnosis not present

## 2021-06-14 DIAGNOSIS — J3089 Other allergic rhinitis: Secondary | ICD-10-CM | POA: Diagnosis not present

## 2021-06-21 DIAGNOSIS — J301 Allergic rhinitis due to pollen: Secondary | ICD-10-CM | POA: Diagnosis not present

## 2021-06-21 DIAGNOSIS — J3089 Other allergic rhinitis: Secondary | ICD-10-CM | POA: Diagnosis not present

## 2021-06-21 DIAGNOSIS — J3081 Allergic rhinitis due to animal (cat) (dog) hair and dander: Secondary | ICD-10-CM | POA: Diagnosis not present

## 2021-06-24 DIAGNOSIS — J3089 Other allergic rhinitis: Secondary | ICD-10-CM | POA: Diagnosis not present

## 2021-06-24 DIAGNOSIS — J301 Allergic rhinitis due to pollen: Secondary | ICD-10-CM | POA: Diagnosis not present

## 2021-06-24 DIAGNOSIS — J3081 Allergic rhinitis due to animal (cat) (dog) hair and dander: Secondary | ICD-10-CM | POA: Diagnosis not present

## 2021-06-24 DIAGNOSIS — H1045 Other chronic allergic conjunctivitis: Secondary | ICD-10-CM | POA: Diagnosis not present

## 2021-06-28 DIAGNOSIS — J301 Allergic rhinitis due to pollen: Secondary | ICD-10-CM | POA: Diagnosis not present

## 2021-06-28 DIAGNOSIS — J3081 Allergic rhinitis due to animal (cat) (dog) hair and dander: Secondary | ICD-10-CM | POA: Diagnosis not present

## 2021-06-28 DIAGNOSIS — J3089 Other allergic rhinitis: Secondary | ICD-10-CM | POA: Diagnosis not present

## 2021-07-05 DIAGNOSIS — J3089 Other allergic rhinitis: Secondary | ICD-10-CM | POA: Diagnosis not present

## 2021-07-05 DIAGNOSIS — J301 Allergic rhinitis due to pollen: Secondary | ICD-10-CM | POA: Diagnosis not present

## 2021-07-05 DIAGNOSIS — J3081 Allergic rhinitis due to animal (cat) (dog) hair and dander: Secondary | ICD-10-CM | POA: Diagnosis not present

## 2021-07-12 DIAGNOSIS — J3089 Other allergic rhinitis: Secondary | ICD-10-CM | POA: Diagnosis not present

## 2021-07-12 DIAGNOSIS — J3081 Allergic rhinitis due to animal (cat) (dog) hair and dander: Secondary | ICD-10-CM | POA: Diagnosis not present

## 2021-07-12 DIAGNOSIS — J301 Allergic rhinitis due to pollen: Secondary | ICD-10-CM | POA: Diagnosis not present

## 2021-07-19 DIAGNOSIS — J3081 Allergic rhinitis due to animal (cat) (dog) hair and dander: Secondary | ICD-10-CM | POA: Diagnosis not present

## 2021-07-19 DIAGNOSIS — J3089 Other allergic rhinitis: Secondary | ICD-10-CM | POA: Diagnosis not present

## 2021-07-19 DIAGNOSIS — J301 Allergic rhinitis due to pollen: Secondary | ICD-10-CM | POA: Diagnosis not present

## 2021-07-26 DIAGNOSIS — J3081 Allergic rhinitis due to animal (cat) (dog) hair and dander: Secondary | ICD-10-CM | POA: Diagnosis not present

## 2021-07-26 DIAGNOSIS — J3089 Other allergic rhinitis: Secondary | ICD-10-CM | POA: Diagnosis not present

## 2021-07-26 DIAGNOSIS — J301 Allergic rhinitis due to pollen: Secondary | ICD-10-CM | POA: Diagnosis not present

## 2021-08-02 DIAGNOSIS — J3089 Other allergic rhinitis: Secondary | ICD-10-CM | POA: Diagnosis not present

## 2021-08-02 DIAGNOSIS — J3081 Allergic rhinitis due to animal (cat) (dog) hair and dander: Secondary | ICD-10-CM | POA: Diagnosis not present

## 2021-08-02 DIAGNOSIS — J301 Allergic rhinitis due to pollen: Secondary | ICD-10-CM | POA: Diagnosis not present

## 2021-08-09 DIAGNOSIS — J3089 Other allergic rhinitis: Secondary | ICD-10-CM | POA: Diagnosis not present

## 2021-08-09 DIAGNOSIS — J3081 Allergic rhinitis due to animal (cat) (dog) hair and dander: Secondary | ICD-10-CM | POA: Diagnosis not present

## 2021-08-09 DIAGNOSIS — J301 Allergic rhinitis due to pollen: Secondary | ICD-10-CM | POA: Diagnosis not present

## 2021-08-16 DIAGNOSIS — J3081 Allergic rhinitis due to animal (cat) (dog) hair and dander: Secondary | ICD-10-CM | POA: Diagnosis not present

## 2021-08-16 DIAGNOSIS — J301 Allergic rhinitis due to pollen: Secondary | ICD-10-CM | POA: Diagnosis not present

## 2021-08-16 DIAGNOSIS — J3089 Other allergic rhinitis: Secondary | ICD-10-CM | POA: Diagnosis not present

## 2021-08-23 DIAGNOSIS — J3089 Other allergic rhinitis: Secondary | ICD-10-CM | POA: Diagnosis not present

## 2021-08-23 DIAGNOSIS — J3081 Allergic rhinitis due to animal (cat) (dog) hair and dander: Secondary | ICD-10-CM | POA: Diagnosis not present

## 2021-08-23 DIAGNOSIS — J301 Allergic rhinitis due to pollen: Secondary | ICD-10-CM | POA: Diagnosis not present

## 2021-08-30 DIAGNOSIS — J3081 Allergic rhinitis due to animal (cat) (dog) hair and dander: Secondary | ICD-10-CM | POA: Diagnosis not present

## 2021-08-30 DIAGNOSIS — J3089 Other allergic rhinitis: Secondary | ICD-10-CM | POA: Diagnosis not present

## 2021-08-30 DIAGNOSIS — J301 Allergic rhinitis due to pollen: Secondary | ICD-10-CM | POA: Diagnosis not present

## 2021-09-01 DIAGNOSIS — J301 Allergic rhinitis due to pollen: Secondary | ICD-10-CM | POA: Diagnosis not present

## 2021-09-02 DIAGNOSIS — J3089 Other allergic rhinitis: Secondary | ICD-10-CM | POA: Diagnosis not present

## 2021-09-06 DIAGNOSIS — J301 Allergic rhinitis due to pollen: Secondary | ICD-10-CM | POA: Diagnosis not present

## 2021-09-06 DIAGNOSIS — J3089 Other allergic rhinitis: Secondary | ICD-10-CM | POA: Diagnosis not present

## 2021-09-06 DIAGNOSIS — J3081 Allergic rhinitis due to animal (cat) (dog) hair and dander: Secondary | ICD-10-CM | POA: Diagnosis not present

## 2021-09-11 DIAGNOSIS — J029 Acute pharyngitis, unspecified: Secondary | ICD-10-CM | POA: Diagnosis not present

## 2021-09-13 DIAGNOSIS — J3081 Allergic rhinitis due to animal (cat) (dog) hair and dander: Secondary | ICD-10-CM | POA: Diagnosis not present

## 2021-09-13 DIAGNOSIS — J3089 Other allergic rhinitis: Secondary | ICD-10-CM | POA: Diagnosis not present

## 2021-09-13 DIAGNOSIS — J301 Allergic rhinitis due to pollen: Secondary | ICD-10-CM | POA: Diagnosis not present

## 2021-09-20 DIAGNOSIS — J3089 Other allergic rhinitis: Secondary | ICD-10-CM | POA: Diagnosis not present

## 2021-09-20 DIAGNOSIS — J301 Allergic rhinitis due to pollen: Secondary | ICD-10-CM | POA: Diagnosis not present

## 2021-09-20 DIAGNOSIS — J3081 Allergic rhinitis due to animal (cat) (dog) hair and dander: Secondary | ICD-10-CM | POA: Diagnosis not present

## 2021-09-27 DIAGNOSIS — J3081 Allergic rhinitis due to animal (cat) (dog) hair and dander: Secondary | ICD-10-CM | POA: Diagnosis not present

## 2021-09-27 DIAGNOSIS — J301 Allergic rhinitis due to pollen: Secondary | ICD-10-CM | POA: Diagnosis not present

## 2021-09-27 DIAGNOSIS — J3089 Other allergic rhinitis: Secondary | ICD-10-CM | POA: Diagnosis not present

## 2021-10-04 DIAGNOSIS — J301 Allergic rhinitis due to pollen: Secondary | ICD-10-CM | POA: Diagnosis not present

## 2021-10-04 DIAGNOSIS — J3089 Other allergic rhinitis: Secondary | ICD-10-CM | POA: Diagnosis not present

## 2021-10-04 DIAGNOSIS — J3081 Allergic rhinitis due to animal (cat) (dog) hair and dander: Secondary | ICD-10-CM | POA: Diagnosis not present

## 2021-10-11 DIAGNOSIS — J3089 Other allergic rhinitis: Secondary | ICD-10-CM | POA: Diagnosis not present

## 2021-10-11 DIAGNOSIS — J301 Allergic rhinitis due to pollen: Secondary | ICD-10-CM | POA: Diagnosis not present

## 2021-10-11 DIAGNOSIS — J3081 Allergic rhinitis due to animal (cat) (dog) hair and dander: Secondary | ICD-10-CM | POA: Diagnosis not present

## 2021-10-18 DIAGNOSIS — J3081 Allergic rhinitis due to animal (cat) (dog) hair and dander: Secondary | ICD-10-CM | POA: Diagnosis not present

## 2021-10-18 DIAGNOSIS — J301 Allergic rhinitis due to pollen: Secondary | ICD-10-CM | POA: Diagnosis not present

## 2021-10-18 DIAGNOSIS — J3089 Other allergic rhinitis: Secondary | ICD-10-CM | POA: Diagnosis not present

## 2021-10-25 DIAGNOSIS — J3089 Other allergic rhinitis: Secondary | ICD-10-CM | POA: Diagnosis not present

## 2021-10-25 DIAGNOSIS — J301 Allergic rhinitis due to pollen: Secondary | ICD-10-CM | POA: Diagnosis not present

## 2021-10-25 DIAGNOSIS — J3081 Allergic rhinitis due to animal (cat) (dog) hair and dander: Secondary | ICD-10-CM | POA: Diagnosis not present

## 2021-11-01 DIAGNOSIS — J3081 Allergic rhinitis due to animal (cat) (dog) hair and dander: Secondary | ICD-10-CM | POA: Diagnosis not present

## 2021-11-01 DIAGNOSIS — J301 Allergic rhinitis due to pollen: Secondary | ICD-10-CM | POA: Diagnosis not present

## 2021-11-01 DIAGNOSIS — J3089 Other allergic rhinitis: Secondary | ICD-10-CM | POA: Diagnosis not present

## 2021-11-08 DIAGNOSIS — J301 Allergic rhinitis due to pollen: Secondary | ICD-10-CM | POA: Diagnosis not present

## 2021-11-08 DIAGNOSIS — J3089 Other allergic rhinitis: Secondary | ICD-10-CM | POA: Diagnosis not present

## 2021-11-08 DIAGNOSIS — J3081 Allergic rhinitis due to animal (cat) (dog) hair and dander: Secondary | ICD-10-CM | POA: Diagnosis not present

## 2021-11-15 DIAGNOSIS — J3089 Other allergic rhinitis: Secondary | ICD-10-CM | POA: Diagnosis not present

## 2021-11-15 DIAGNOSIS — J3081 Allergic rhinitis due to animal (cat) (dog) hair and dander: Secondary | ICD-10-CM | POA: Diagnosis not present

## 2021-11-15 DIAGNOSIS — J301 Allergic rhinitis due to pollen: Secondary | ICD-10-CM | POA: Diagnosis not present

## 2021-11-21 DIAGNOSIS — J3089 Other allergic rhinitis: Secondary | ICD-10-CM | POA: Diagnosis not present

## 2021-11-21 DIAGNOSIS — J3081 Allergic rhinitis due to animal (cat) (dog) hair and dander: Secondary | ICD-10-CM | POA: Diagnosis not present

## 2021-11-21 DIAGNOSIS — J301 Allergic rhinitis due to pollen: Secondary | ICD-10-CM | POA: Diagnosis not present

## 2021-11-28 DIAGNOSIS — J3081 Allergic rhinitis due to animal (cat) (dog) hair and dander: Secondary | ICD-10-CM | POA: Diagnosis not present

## 2021-11-28 DIAGNOSIS — J301 Allergic rhinitis due to pollen: Secondary | ICD-10-CM | POA: Diagnosis not present

## 2021-11-28 DIAGNOSIS — J3089 Other allergic rhinitis: Secondary | ICD-10-CM | POA: Diagnosis not present

## 2021-12-06 DIAGNOSIS — J301 Allergic rhinitis due to pollen: Secondary | ICD-10-CM | POA: Diagnosis not present

## 2021-12-06 DIAGNOSIS — J3089 Other allergic rhinitis: Secondary | ICD-10-CM | POA: Diagnosis not present

## 2021-12-06 DIAGNOSIS — J3081 Allergic rhinitis due to animal (cat) (dog) hair and dander: Secondary | ICD-10-CM | POA: Diagnosis not present

## 2021-12-13 DIAGNOSIS — J301 Allergic rhinitis due to pollen: Secondary | ICD-10-CM | POA: Diagnosis not present

## 2021-12-13 DIAGNOSIS — J3089 Other allergic rhinitis: Secondary | ICD-10-CM | POA: Diagnosis not present

## 2021-12-13 DIAGNOSIS — J3081 Allergic rhinitis due to animal (cat) (dog) hair and dander: Secondary | ICD-10-CM | POA: Diagnosis not present

## 2021-12-14 DIAGNOSIS — J351 Hypertrophy of tonsils: Secondary | ICD-10-CM | POA: Diagnosis not present

## 2021-12-14 DIAGNOSIS — R030 Elevated blood-pressure reading, without diagnosis of hypertension: Secondary | ICD-10-CM | POA: Diagnosis not present

## 2021-12-20 DIAGNOSIS — J3081 Allergic rhinitis due to animal (cat) (dog) hair and dander: Secondary | ICD-10-CM | POA: Diagnosis not present

## 2021-12-20 DIAGNOSIS — J301 Allergic rhinitis due to pollen: Secondary | ICD-10-CM | POA: Diagnosis not present

## 2021-12-20 DIAGNOSIS — J3089 Other allergic rhinitis: Secondary | ICD-10-CM | POA: Diagnosis not present

## 2021-12-27 DIAGNOSIS — J3081 Allergic rhinitis due to animal (cat) (dog) hair and dander: Secondary | ICD-10-CM | POA: Diagnosis not present

## 2021-12-27 DIAGNOSIS — J3089 Other allergic rhinitis: Secondary | ICD-10-CM | POA: Diagnosis not present

## 2021-12-27 DIAGNOSIS — J301 Allergic rhinitis due to pollen: Secondary | ICD-10-CM | POA: Diagnosis not present

## 2022-01-03 DIAGNOSIS — J3081 Allergic rhinitis due to animal (cat) (dog) hair and dander: Secondary | ICD-10-CM | POA: Diagnosis not present

## 2022-01-03 DIAGNOSIS — J301 Allergic rhinitis due to pollen: Secondary | ICD-10-CM | POA: Diagnosis not present

## 2022-01-03 DIAGNOSIS — J3089 Other allergic rhinitis: Secondary | ICD-10-CM | POA: Diagnosis not present

## 2022-01-10 DIAGNOSIS — J3089 Other allergic rhinitis: Secondary | ICD-10-CM | POA: Diagnosis not present

## 2022-01-10 DIAGNOSIS — J301 Allergic rhinitis due to pollen: Secondary | ICD-10-CM | POA: Diagnosis not present

## 2022-01-10 DIAGNOSIS — J3081 Allergic rhinitis due to animal (cat) (dog) hair and dander: Secondary | ICD-10-CM | POA: Diagnosis not present

## 2022-01-15 DIAGNOSIS — R109 Unspecified abdominal pain: Secondary | ICD-10-CM | POA: Diagnosis not present

## 2022-01-15 DIAGNOSIS — R300 Dysuria: Secondary | ICD-10-CM | POA: Diagnosis not present

## 2022-01-16 DIAGNOSIS — R300 Dysuria: Secondary | ICD-10-CM | POA: Diagnosis not present

## 2022-01-20 DIAGNOSIS — J3089 Other allergic rhinitis: Secondary | ICD-10-CM | POA: Diagnosis not present

## 2022-01-20 DIAGNOSIS — J301 Allergic rhinitis due to pollen: Secondary | ICD-10-CM | POA: Diagnosis not present

## 2022-01-27 DIAGNOSIS — J3081 Allergic rhinitis due to animal (cat) (dog) hair and dander: Secondary | ICD-10-CM | POA: Diagnosis not present

## 2022-01-27 DIAGNOSIS — J301 Allergic rhinitis due to pollen: Secondary | ICD-10-CM | POA: Diagnosis not present

## 2022-01-27 DIAGNOSIS — H1045 Other chronic allergic conjunctivitis: Secondary | ICD-10-CM | POA: Diagnosis not present

## 2022-01-27 DIAGNOSIS — J3089 Other allergic rhinitis: Secondary | ICD-10-CM | POA: Diagnosis not present

## 2022-02-02 DIAGNOSIS — J301 Allergic rhinitis due to pollen: Secondary | ICD-10-CM | POA: Diagnosis not present

## 2022-02-02 DIAGNOSIS — J3089 Other allergic rhinitis: Secondary | ICD-10-CM | POA: Diagnosis not present

## 2022-02-02 DIAGNOSIS — J3081 Allergic rhinitis due to animal (cat) (dog) hair and dander: Secondary | ICD-10-CM | POA: Diagnosis not present

## 2022-02-10 DIAGNOSIS — J3089 Other allergic rhinitis: Secondary | ICD-10-CM | POA: Diagnosis not present

## 2022-02-10 DIAGNOSIS — J3081 Allergic rhinitis due to animal (cat) (dog) hair and dander: Secondary | ICD-10-CM | POA: Diagnosis not present

## 2022-02-10 DIAGNOSIS — J301 Allergic rhinitis due to pollen: Secondary | ICD-10-CM | POA: Diagnosis not present

## 2022-02-15 DIAGNOSIS — J301 Allergic rhinitis due to pollen: Secondary | ICD-10-CM | POA: Diagnosis not present

## 2022-02-15 DIAGNOSIS — J3081 Allergic rhinitis due to animal (cat) (dog) hair and dander: Secondary | ICD-10-CM | POA: Diagnosis not present

## 2022-02-16 DIAGNOSIS — J3089 Other allergic rhinitis: Secondary | ICD-10-CM | POA: Diagnosis not present

## 2022-02-20 DIAGNOSIS — J301 Allergic rhinitis due to pollen: Secondary | ICD-10-CM | POA: Diagnosis not present

## 2022-02-20 DIAGNOSIS — J3089 Other allergic rhinitis: Secondary | ICD-10-CM | POA: Diagnosis not present

## 2022-02-28 DIAGNOSIS — J3089 Other allergic rhinitis: Secondary | ICD-10-CM | POA: Diagnosis not present

## 2022-02-28 DIAGNOSIS — J3081 Allergic rhinitis due to animal (cat) (dog) hair and dander: Secondary | ICD-10-CM | POA: Diagnosis not present

## 2022-02-28 DIAGNOSIS — J301 Allergic rhinitis due to pollen: Secondary | ICD-10-CM | POA: Diagnosis not present

## 2022-03-07 DIAGNOSIS — J301 Allergic rhinitis due to pollen: Secondary | ICD-10-CM | POA: Diagnosis not present

## 2022-03-07 DIAGNOSIS — J3089 Other allergic rhinitis: Secondary | ICD-10-CM | POA: Diagnosis not present

## 2022-03-07 DIAGNOSIS — J3081 Allergic rhinitis due to animal (cat) (dog) hair and dander: Secondary | ICD-10-CM | POA: Diagnosis not present

## 2022-03-14 DIAGNOSIS — J3089 Other allergic rhinitis: Secondary | ICD-10-CM | POA: Diagnosis not present

## 2022-03-14 DIAGNOSIS — J301 Allergic rhinitis due to pollen: Secondary | ICD-10-CM | POA: Diagnosis not present

## 2022-03-14 DIAGNOSIS — J3081 Allergic rhinitis due to animal (cat) (dog) hair and dander: Secondary | ICD-10-CM | POA: Diagnosis not present

## 2022-03-21 DIAGNOSIS — J3081 Allergic rhinitis due to animal (cat) (dog) hair and dander: Secondary | ICD-10-CM | POA: Diagnosis not present

## 2022-03-21 DIAGNOSIS — J3089 Other allergic rhinitis: Secondary | ICD-10-CM | POA: Diagnosis not present

## 2022-03-21 DIAGNOSIS — J301 Allergic rhinitis due to pollen: Secondary | ICD-10-CM | POA: Diagnosis not present

## 2022-03-28 DIAGNOSIS — J3081 Allergic rhinitis due to animal (cat) (dog) hair and dander: Secondary | ICD-10-CM | POA: Diagnosis not present

## 2022-03-28 DIAGNOSIS — J3089 Other allergic rhinitis: Secondary | ICD-10-CM | POA: Diagnosis not present

## 2022-03-28 DIAGNOSIS — J301 Allergic rhinitis due to pollen: Secondary | ICD-10-CM | POA: Diagnosis not present

## 2022-04-04 DIAGNOSIS — J301 Allergic rhinitis due to pollen: Secondary | ICD-10-CM | POA: Diagnosis not present

## 2022-04-04 DIAGNOSIS — J3081 Allergic rhinitis due to animal (cat) (dog) hair and dander: Secondary | ICD-10-CM | POA: Diagnosis not present

## 2022-04-04 DIAGNOSIS — J3089 Other allergic rhinitis: Secondary | ICD-10-CM | POA: Diagnosis not present

## 2022-04-11 DIAGNOSIS — J3089 Other allergic rhinitis: Secondary | ICD-10-CM | POA: Diagnosis not present

## 2022-04-11 DIAGNOSIS — J3081 Allergic rhinitis due to animal (cat) (dog) hair and dander: Secondary | ICD-10-CM | POA: Diagnosis not present

## 2022-04-11 DIAGNOSIS — J301 Allergic rhinitis due to pollen: Secondary | ICD-10-CM | POA: Diagnosis not present

## 2022-04-18 DIAGNOSIS — J301 Allergic rhinitis due to pollen: Secondary | ICD-10-CM | POA: Diagnosis not present

## 2022-04-18 DIAGNOSIS — J3089 Other allergic rhinitis: Secondary | ICD-10-CM | POA: Diagnosis not present

## 2022-04-18 DIAGNOSIS — J3081 Allergic rhinitis due to animal (cat) (dog) hair and dander: Secondary | ICD-10-CM | POA: Diagnosis not present

## 2022-04-20 DIAGNOSIS — F419 Anxiety disorder, unspecified: Secondary | ICD-10-CM | POA: Diagnosis not present

## 2022-04-20 DIAGNOSIS — E663 Overweight: Secondary | ICD-10-CM | POA: Diagnosis not present

## 2022-04-25 DIAGNOSIS — J3081 Allergic rhinitis due to animal (cat) (dog) hair and dander: Secondary | ICD-10-CM | POA: Diagnosis not present

## 2022-04-25 DIAGNOSIS — J3089 Other allergic rhinitis: Secondary | ICD-10-CM | POA: Diagnosis not present

## 2022-04-25 DIAGNOSIS — J301 Allergic rhinitis due to pollen: Secondary | ICD-10-CM | POA: Diagnosis not present

## 2022-05-02 DIAGNOSIS — J301 Allergic rhinitis due to pollen: Secondary | ICD-10-CM | POA: Diagnosis not present

## 2022-05-02 DIAGNOSIS — J3081 Allergic rhinitis due to animal (cat) (dog) hair and dander: Secondary | ICD-10-CM | POA: Diagnosis not present

## 2022-05-02 DIAGNOSIS — J3089 Other allergic rhinitis: Secondary | ICD-10-CM | POA: Diagnosis not present

## 2022-05-09 DIAGNOSIS — J301 Allergic rhinitis due to pollen: Secondary | ICD-10-CM | POA: Diagnosis not present

## 2022-05-09 DIAGNOSIS — J3089 Other allergic rhinitis: Secondary | ICD-10-CM | POA: Diagnosis not present

## 2022-05-09 DIAGNOSIS — J3081 Allergic rhinitis due to animal (cat) (dog) hair and dander: Secondary | ICD-10-CM | POA: Diagnosis not present

## 2022-05-10 DIAGNOSIS — Z1331 Encounter for screening for depression: Secondary | ICD-10-CM | POA: Diagnosis not present

## 2022-05-10 DIAGNOSIS — Z1339 Encounter for screening examination for other mental health and behavioral disorders: Secondary | ICD-10-CM | POA: Diagnosis not present

## 2022-05-10 DIAGNOSIS — G5601 Carpal tunnel syndrome, right upper limb: Secondary | ICD-10-CM | POA: Diagnosis not present

## 2022-05-10 DIAGNOSIS — Z0000 Encounter for general adult medical examination without abnormal findings: Secondary | ICD-10-CM | POA: Diagnosis not present

## 2022-05-10 DIAGNOSIS — R82998 Other abnormal findings in urine: Secondary | ICD-10-CM | POA: Diagnosis not present

## 2022-05-16 DIAGNOSIS — J3081 Allergic rhinitis due to animal (cat) (dog) hair and dander: Secondary | ICD-10-CM | POA: Diagnosis not present

## 2022-05-16 DIAGNOSIS — J301 Allergic rhinitis due to pollen: Secondary | ICD-10-CM | POA: Diagnosis not present

## 2022-05-16 DIAGNOSIS — J3089 Other allergic rhinitis: Secondary | ICD-10-CM | POA: Diagnosis not present

## 2022-05-23 DIAGNOSIS — J3081 Allergic rhinitis due to animal (cat) (dog) hair and dander: Secondary | ICD-10-CM | POA: Diagnosis not present

## 2022-05-23 DIAGNOSIS — J3089 Other allergic rhinitis: Secondary | ICD-10-CM | POA: Diagnosis not present

## 2022-05-23 DIAGNOSIS — J301 Allergic rhinitis due to pollen: Secondary | ICD-10-CM | POA: Diagnosis not present

## 2022-05-30 DIAGNOSIS — J3089 Other allergic rhinitis: Secondary | ICD-10-CM | POA: Diagnosis not present

## 2022-05-30 DIAGNOSIS — J301 Allergic rhinitis due to pollen: Secondary | ICD-10-CM | POA: Diagnosis not present

## 2022-05-30 DIAGNOSIS — J3081 Allergic rhinitis due to animal (cat) (dog) hair and dander: Secondary | ICD-10-CM | POA: Diagnosis not present

## 2022-06-06 DIAGNOSIS — J3089 Other allergic rhinitis: Secondary | ICD-10-CM | POA: Diagnosis not present

## 2022-06-06 DIAGNOSIS — J301 Allergic rhinitis due to pollen: Secondary | ICD-10-CM | POA: Diagnosis not present

## 2022-06-06 DIAGNOSIS — J3081 Allergic rhinitis due to animal (cat) (dog) hair and dander: Secondary | ICD-10-CM | POA: Diagnosis not present

## 2022-06-13 DIAGNOSIS — J3089 Other allergic rhinitis: Secondary | ICD-10-CM | POA: Diagnosis not present

## 2022-06-13 DIAGNOSIS — J301 Allergic rhinitis due to pollen: Secondary | ICD-10-CM | POA: Diagnosis not present

## 2022-06-13 DIAGNOSIS — J3081 Allergic rhinitis due to animal (cat) (dog) hair and dander: Secondary | ICD-10-CM | POA: Diagnosis not present

## 2022-06-20 DIAGNOSIS — J3089 Other allergic rhinitis: Secondary | ICD-10-CM | POA: Diagnosis not present

## 2022-06-20 DIAGNOSIS — J3081 Allergic rhinitis due to animal (cat) (dog) hair and dander: Secondary | ICD-10-CM | POA: Diagnosis not present

## 2022-06-20 DIAGNOSIS — J301 Allergic rhinitis due to pollen: Secondary | ICD-10-CM | POA: Diagnosis not present

## 2022-06-27 DIAGNOSIS — J3089 Other allergic rhinitis: Secondary | ICD-10-CM | POA: Diagnosis not present

## 2022-06-27 DIAGNOSIS — J3081 Allergic rhinitis due to animal (cat) (dog) hair and dander: Secondary | ICD-10-CM | POA: Diagnosis not present

## 2022-06-27 DIAGNOSIS — J301 Allergic rhinitis due to pollen: Secondary | ICD-10-CM | POA: Diagnosis not present

## 2022-07-04 DIAGNOSIS — J3081 Allergic rhinitis due to animal (cat) (dog) hair and dander: Secondary | ICD-10-CM | POA: Diagnosis not present

## 2022-07-04 DIAGNOSIS — J3089 Other allergic rhinitis: Secondary | ICD-10-CM | POA: Diagnosis not present

## 2022-07-04 DIAGNOSIS — J301 Allergic rhinitis due to pollen: Secondary | ICD-10-CM | POA: Diagnosis not present

## 2022-07-11 DIAGNOSIS — J3089 Other allergic rhinitis: Secondary | ICD-10-CM | POA: Diagnosis not present

## 2022-07-11 DIAGNOSIS — J3081 Allergic rhinitis due to animal (cat) (dog) hair and dander: Secondary | ICD-10-CM | POA: Diagnosis not present

## 2022-07-11 DIAGNOSIS — J301 Allergic rhinitis due to pollen: Secondary | ICD-10-CM | POA: Diagnosis not present

## 2022-07-18 DIAGNOSIS — J3089 Other allergic rhinitis: Secondary | ICD-10-CM | POA: Diagnosis not present

## 2022-07-18 DIAGNOSIS — J301 Allergic rhinitis due to pollen: Secondary | ICD-10-CM | POA: Diagnosis not present

## 2022-07-18 DIAGNOSIS — J3081 Allergic rhinitis due to animal (cat) (dog) hair and dander: Secondary | ICD-10-CM | POA: Diagnosis not present

## 2022-07-25 DIAGNOSIS — J301 Allergic rhinitis due to pollen: Secondary | ICD-10-CM | POA: Diagnosis not present

## 2022-07-25 DIAGNOSIS — J3089 Other allergic rhinitis: Secondary | ICD-10-CM | POA: Diagnosis not present

## 2022-07-25 DIAGNOSIS — J3081 Allergic rhinitis due to animal (cat) (dog) hair and dander: Secondary | ICD-10-CM | POA: Diagnosis not present

## 2022-08-01 DIAGNOSIS — J301 Allergic rhinitis due to pollen: Secondary | ICD-10-CM | POA: Diagnosis not present

## 2022-08-01 DIAGNOSIS — J3081 Allergic rhinitis due to animal (cat) (dog) hair and dander: Secondary | ICD-10-CM | POA: Diagnosis not present

## 2022-08-01 DIAGNOSIS — J3089 Other allergic rhinitis: Secondary | ICD-10-CM | POA: Diagnosis not present

## 2022-08-08 DIAGNOSIS — J3089 Other allergic rhinitis: Secondary | ICD-10-CM | POA: Diagnosis not present

## 2022-08-08 DIAGNOSIS — J301 Allergic rhinitis due to pollen: Secondary | ICD-10-CM | POA: Diagnosis not present

## 2022-08-08 DIAGNOSIS — J3081 Allergic rhinitis due to animal (cat) (dog) hair and dander: Secondary | ICD-10-CM | POA: Diagnosis not present

## 2022-08-15 DIAGNOSIS — J3081 Allergic rhinitis due to animal (cat) (dog) hair and dander: Secondary | ICD-10-CM | POA: Diagnosis not present

## 2022-08-15 DIAGNOSIS — J301 Allergic rhinitis due to pollen: Secondary | ICD-10-CM | POA: Diagnosis not present

## 2022-08-15 DIAGNOSIS — J3089 Other allergic rhinitis: Secondary | ICD-10-CM | POA: Diagnosis not present

## 2022-08-22 DIAGNOSIS — J301 Allergic rhinitis due to pollen: Secondary | ICD-10-CM | POA: Diagnosis not present

## 2022-08-22 DIAGNOSIS — J3081 Allergic rhinitis due to animal (cat) (dog) hair and dander: Secondary | ICD-10-CM | POA: Diagnosis not present

## 2022-08-22 DIAGNOSIS — J3089 Other allergic rhinitis: Secondary | ICD-10-CM | POA: Diagnosis not present

## 2022-08-29 DIAGNOSIS — J3081 Allergic rhinitis due to animal (cat) (dog) hair and dander: Secondary | ICD-10-CM | POA: Diagnosis not present

## 2022-08-29 DIAGNOSIS — J3089 Other allergic rhinitis: Secondary | ICD-10-CM | POA: Diagnosis not present

## 2022-08-29 DIAGNOSIS — J301 Allergic rhinitis due to pollen: Secondary | ICD-10-CM | POA: Diagnosis not present

## 2022-09-05 DIAGNOSIS — J3081 Allergic rhinitis due to animal (cat) (dog) hair and dander: Secondary | ICD-10-CM | POA: Diagnosis not present

## 2022-09-05 DIAGNOSIS — J301 Allergic rhinitis due to pollen: Secondary | ICD-10-CM | POA: Diagnosis not present

## 2022-09-05 DIAGNOSIS — J3089 Other allergic rhinitis: Secondary | ICD-10-CM | POA: Diagnosis not present

## 2022-09-06 DIAGNOSIS — J3089 Other allergic rhinitis: Secondary | ICD-10-CM | POA: Diagnosis not present

## 2022-09-12 DIAGNOSIS — J301 Allergic rhinitis due to pollen: Secondary | ICD-10-CM | POA: Diagnosis not present

## 2022-09-12 DIAGNOSIS — J3081 Allergic rhinitis due to animal (cat) (dog) hair and dander: Secondary | ICD-10-CM | POA: Diagnosis not present

## 2022-09-12 DIAGNOSIS — J3089 Other allergic rhinitis: Secondary | ICD-10-CM | POA: Diagnosis not present

## 2022-09-19 DIAGNOSIS — J301 Allergic rhinitis due to pollen: Secondary | ICD-10-CM | POA: Diagnosis not present

## 2022-09-19 DIAGNOSIS — J3089 Other allergic rhinitis: Secondary | ICD-10-CM | POA: Diagnosis not present

## 2022-09-19 DIAGNOSIS — J3081 Allergic rhinitis due to animal (cat) (dog) hair and dander: Secondary | ICD-10-CM | POA: Diagnosis not present

## 2022-09-26 DIAGNOSIS — J3089 Other allergic rhinitis: Secondary | ICD-10-CM | POA: Diagnosis not present

## 2022-09-26 DIAGNOSIS — J301 Allergic rhinitis due to pollen: Secondary | ICD-10-CM | POA: Diagnosis not present

## 2022-09-26 DIAGNOSIS — J3081 Allergic rhinitis due to animal (cat) (dog) hair and dander: Secondary | ICD-10-CM | POA: Diagnosis not present

## 2022-10-02 DIAGNOSIS — J302 Other seasonal allergic rhinitis: Secondary | ICD-10-CM | POA: Diagnosis not present

## 2022-10-02 DIAGNOSIS — J029 Acute pharyngitis, unspecified: Secondary | ICD-10-CM | POA: Diagnosis not present

## 2022-10-03 DIAGNOSIS — J3089 Other allergic rhinitis: Secondary | ICD-10-CM | POA: Diagnosis not present

## 2022-10-03 DIAGNOSIS — J301 Allergic rhinitis due to pollen: Secondary | ICD-10-CM | POA: Diagnosis not present

## 2022-10-03 DIAGNOSIS — J3081 Allergic rhinitis due to animal (cat) (dog) hair and dander: Secondary | ICD-10-CM | POA: Diagnosis not present

## 2022-10-10 DIAGNOSIS — J301 Allergic rhinitis due to pollen: Secondary | ICD-10-CM | POA: Diagnosis not present

## 2022-10-10 DIAGNOSIS — J3089 Other allergic rhinitis: Secondary | ICD-10-CM | POA: Diagnosis not present

## 2022-10-10 DIAGNOSIS — J3081 Allergic rhinitis due to animal (cat) (dog) hair and dander: Secondary | ICD-10-CM | POA: Diagnosis not present

## 2022-10-17 DIAGNOSIS — J301 Allergic rhinitis due to pollen: Secondary | ICD-10-CM | POA: Diagnosis not present

## 2022-10-17 DIAGNOSIS — J3089 Other allergic rhinitis: Secondary | ICD-10-CM | POA: Diagnosis not present

## 2022-10-17 DIAGNOSIS — J3081 Allergic rhinitis due to animal (cat) (dog) hair and dander: Secondary | ICD-10-CM | POA: Diagnosis not present

## 2022-10-24 DIAGNOSIS — J3081 Allergic rhinitis due to animal (cat) (dog) hair and dander: Secondary | ICD-10-CM | POA: Diagnosis not present

## 2022-10-24 DIAGNOSIS — J3089 Other allergic rhinitis: Secondary | ICD-10-CM | POA: Diagnosis not present

## 2022-10-24 DIAGNOSIS — J301 Allergic rhinitis due to pollen: Secondary | ICD-10-CM | POA: Diagnosis not present

## 2022-10-31 DIAGNOSIS — J301 Allergic rhinitis due to pollen: Secondary | ICD-10-CM | POA: Diagnosis not present

## 2022-10-31 DIAGNOSIS — J3089 Other allergic rhinitis: Secondary | ICD-10-CM | POA: Diagnosis not present

## 2022-10-31 DIAGNOSIS — J3081 Allergic rhinitis due to animal (cat) (dog) hair and dander: Secondary | ICD-10-CM | POA: Diagnosis not present

## 2022-11-07 DIAGNOSIS — J301 Allergic rhinitis due to pollen: Secondary | ICD-10-CM | POA: Diagnosis not present

## 2022-11-07 DIAGNOSIS — J3089 Other allergic rhinitis: Secondary | ICD-10-CM | POA: Diagnosis not present

## 2022-11-07 DIAGNOSIS — J3081 Allergic rhinitis due to animal (cat) (dog) hair and dander: Secondary | ICD-10-CM | POA: Diagnosis not present

## 2022-11-14 DIAGNOSIS — J301 Allergic rhinitis due to pollen: Secondary | ICD-10-CM | POA: Diagnosis not present

## 2022-11-14 DIAGNOSIS — J3081 Allergic rhinitis due to animal (cat) (dog) hair and dander: Secondary | ICD-10-CM | POA: Diagnosis not present

## 2022-11-14 DIAGNOSIS — J3089 Other allergic rhinitis: Secondary | ICD-10-CM | POA: Diagnosis not present

## 2022-11-21 DIAGNOSIS — J3081 Allergic rhinitis due to animal (cat) (dog) hair and dander: Secondary | ICD-10-CM | POA: Diagnosis not present

## 2022-11-21 DIAGNOSIS — J301 Allergic rhinitis due to pollen: Secondary | ICD-10-CM | POA: Diagnosis not present

## 2022-11-21 DIAGNOSIS — J3089 Other allergic rhinitis: Secondary | ICD-10-CM | POA: Diagnosis not present

## 2022-11-28 DIAGNOSIS — J3081 Allergic rhinitis due to animal (cat) (dog) hair and dander: Secondary | ICD-10-CM | POA: Diagnosis not present

## 2022-11-28 DIAGNOSIS — J3089 Other allergic rhinitis: Secondary | ICD-10-CM | POA: Diagnosis not present

## 2022-11-28 DIAGNOSIS — J301 Allergic rhinitis due to pollen: Secondary | ICD-10-CM | POA: Diagnosis not present

## 2022-12-05 DIAGNOSIS — J3089 Other allergic rhinitis: Secondary | ICD-10-CM | POA: Diagnosis not present

## 2022-12-05 DIAGNOSIS — J3081 Allergic rhinitis due to animal (cat) (dog) hair and dander: Secondary | ICD-10-CM | POA: Diagnosis not present

## 2022-12-05 DIAGNOSIS — J301 Allergic rhinitis due to pollen: Secondary | ICD-10-CM | POA: Diagnosis not present

## 2022-12-11 DIAGNOSIS — J3081 Allergic rhinitis due to animal (cat) (dog) hair and dander: Secondary | ICD-10-CM | POA: Diagnosis not present

## 2022-12-11 DIAGNOSIS — J301 Allergic rhinitis due to pollen: Secondary | ICD-10-CM | POA: Diagnosis not present

## 2022-12-11 DIAGNOSIS — J3089 Other allergic rhinitis: Secondary | ICD-10-CM | POA: Diagnosis not present

## 2022-12-19 DIAGNOSIS — J301 Allergic rhinitis due to pollen: Secondary | ICD-10-CM | POA: Diagnosis not present

## 2022-12-19 DIAGNOSIS — J3081 Allergic rhinitis due to animal (cat) (dog) hair and dander: Secondary | ICD-10-CM | POA: Diagnosis not present

## 2022-12-19 DIAGNOSIS — J3089 Other allergic rhinitis: Secondary | ICD-10-CM | POA: Diagnosis not present

## 2022-12-22 DIAGNOSIS — Z23 Encounter for immunization: Secondary | ICD-10-CM | POA: Diagnosis not present

## 2022-12-22 DIAGNOSIS — Z0542 Observation and evaluation of newborn for suspected metabolic condition ruled out: Secondary | ICD-10-CM | POA: Diagnosis not present

## 2022-12-27 DIAGNOSIS — Z412 Encounter for routine and ritual male circumcision: Secondary | ICD-10-CM | POA: Diagnosis not present

## 2022-12-27 DIAGNOSIS — Z2989 Encounter for other specified prophylactic measures: Secondary | ICD-10-CM | POA: Diagnosis not present

## 2022-12-28 DIAGNOSIS — J3089 Other allergic rhinitis: Secondary | ICD-10-CM | POA: Diagnosis not present

## 2022-12-28 DIAGNOSIS — J301 Allergic rhinitis due to pollen: Secondary | ICD-10-CM | POA: Diagnosis not present

## 2022-12-28 DIAGNOSIS — J3081 Allergic rhinitis due to animal (cat) (dog) hair and dander: Secondary | ICD-10-CM | POA: Diagnosis not present

## 2023-01-02 DIAGNOSIS — J301 Allergic rhinitis due to pollen: Secondary | ICD-10-CM | POA: Diagnosis not present

## 2023-01-02 DIAGNOSIS — J3089 Other allergic rhinitis: Secondary | ICD-10-CM | POA: Diagnosis not present

## 2023-01-02 DIAGNOSIS — J3081 Allergic rhinitis due to animal (cat) (dog) hair and dander: Secondary | ICD-10-CM | POA: Diagnosis not present

## 2023-01-09 DIAGNOSIS — J3089 Other allergic rhinitis: Secondary | ICD-10-CM | POA: Diagnosis not present

## 2023-01-09 DIAGNOSIS — J3081 Allergic rhinitis due to animal (cat) (dog) hair and dander: Secondary | ICD-10-CM | POA: Diagnosis not present

## 2023-01-09 DIAGNOSIS — J301 Allergic rhinitis due to pollen: Secondary | ICD-10-CM | POA: Diagnosis not present

## 2023-01-16 DIAGNOSIS — J3081 Allergic rhinitis due to animal (cat) (dog) hair and dander: Secondary | ICD-10-CM | POA: Diagnosis not present

## 2023-01-16 DIAGNOSIS — J3089 Other allergic rhinitis: Secondary | ICD-10-CM | POA: Diagnosis not present

## 2023-01-16 DIAGNOSIS — J301 Allergic rhinitis due to pollen: Secondary | ICD-10-CM | POA: Diagnosis not present

## 2023-01-22 DIAGNOSIS — Z0389 Encounter for observation for other suspected diseases and conditions ruled out: Secondary | ICD-10-CM | POA: Diagnosis not present

## 2023-01-23 DIAGNOSIS — Z00129 Encounter for routine child health examination without abnormal findings: Secondary | ICD-10-CM | POA: Diagnosis not present

## 2023-01-23 DIAGNOSIS — J3089 Other allergic rhinitis: Secondary | ICD-10-CM | POA: Diagnosis not present

## 2023-01-23 DIAGNOSIS — J3081 Allergic rhinitis due to animal (cat) (dog) hair and dander: Secondary | ICD-10-CM | POA: Diagnosis not present

## 2023-01-23 DIAGNOSIS — J301 Allergic rhinitis due to pollen: Secondary | ICD-10-CM | POA: Diagnosis not present

## 2023-01-30 DIAGNOSIS — J3081 Allergic rhinitis due to animal (cat) (dog) hair and dander: Secondary | ICD-10-CM | POA: Diagnosis not present

## 2023-01-30 DIAGNOSIS — J301 Allergic rhinitis due to pollen: Secondary | ICD-10-CM | POA: Diagnosis not present

## 2023-01-30 DIAGNOSIS — J3089 Other allergic rhinitis: Secondary | ICD-10-CM | POA: Diagnosis not present

## 2023-02-06 DIAGNOSIS — J3081 Allergic rhinitis due to animal (cat) (dog) hair and dander: Secondary | ICD-10-CM | POA: Diagnosis not present

## 2023-02-06 DIAGNOSIS — J3089 Other allergic rhinitis: Secondary | ICD-10-CM | POA: Diagnosis not present

## 2023-02-06 DIAGNOSIS — J301 Allergic rhinitis due to pollen: Secondary | ICD-10-CM | POA: Diagnosis not present

## 2023-02-08 DIAGNOSIS — J3081 Allergic rhinitis due to animal (cat) (dog) hair and dander: Secondary | ICD-10-CM | POA: Diagnosis not present

## 2023-02-08 DIAGNOSIS — J3089 Other allergic rhinitis: Secondary | ICD-10-CM | POA: Diagnosis not present

## 2023-02-08 DIAGNOSIS — H1045 Other chronic allergic conjunctivitis: Secondary | ICD-10-CM | POA: Diagnosis not present

## 2023-02-08 DIAGNOSIS — J301 Allergic rhinitis due to pollen: Secondary | ICD-10-CM | POA: Diagnosis not present

## 2023-02-13 DIAGNOSIS — J3089 Other allergic rhinitis: Secondary | ICD-10-CM | POA: Diagnosis not present

## 2023-02-13 DIAGNOSIS — J301 Allergic rhinitis due to pollen: Secondary | ICD-10-CM | POA: Diagnosis not present

## 2023-02-13 DIAGNOSIS — J3081 Allergic rhinitis due to animal (cat) (dog) hair and dander: Secondary | ICD-10-CM | POA: Diagnosis not present

## 2023-02-14 DIAGNOSIS — J3081 Allergic rhinitis due to animal (cat) (dog) hair and dander: Secondary | ICD-10-CM | POA: Diagnosis not present

## 2023-02-14 DIAGNOSIS — J301 Allergic rhinitis due to pollen: Secondary | ICD-10-CM | POA: Diagnosis not present

## 2023-02-15 DIAGNOSIS — J3089 Other allergic rhinitis: Secondary | ICD-10-CM | POA: Diagnosis not present

## 2023-02-20 DIAGNOSIS — J3089 Other allergic rhinitis: Secondary | ICD-10-CM | POA: Diagnosis not present

## 2023-02-20 DIAGNOSIS — J3081 Allergic rhinitis due to animal (cat) (dog) hair and dander: Secondary | ICD-10-CM | POA: Diagnosis not present

## 2023-02-20 DIAGNOSIS — J301 Allergic rhinitis due to pollen: Secondary | ICD-10-CM | POA: Diagnosis not present

## 2023-02-21 DIAGNOSIS — Z00121 Encounter for routine child health examination with abnormal findings: Secondary | ICD-10-CM | POA: Diagnosis not present

## 2023-02-21 DIAGNOSIS — K2100 Gastro-esophageal reflux disease with esophagitis, without bleeding: Secondary | ICD-10-CM | POA: Diagnosis not present

## 2023-02-21 DIAGNOSIS — Z23 Encounter for immunization: Secondary | ICD-10-CM | POA: Diagnosis not present

## 2023-02-27 DIAGNOSIS — J3081 Allergic rhinitis due to animal (cat) (dog) hair and dander: Secondary | ICD-10-CM | POA: Diagnosis not present

## 2023-02-27 DIAGNOSIS — J301 Allergic rhinitis due to pollen: Secondary | ICD-10-CM | POA: Diagnosis not present

## 2023-02-27 DIAGNOSIS — J3089 Other allergic rhinitis: Secondary | ICD-10-CM | POA: Diagnosis not present

## 2023-03-06 DIAGNOSIS — J3089 Other allergic rhinitis: Secondary | ICD-10-CM | POA: Diagnosis not present

## 2023-03-06 DIAGNOSIS — J301 Allergic rhinitis due to pollen: Secondary | ICD-10-CM | POA: Diagnosis not present

## 2023-03-06 DIAGNOSIS — J3081 Allergic rhinitis due to animal (cat) (dog) hair and dander: Secondary | ICD-10-CM | POA: Diagnosis not present

## 2023-03-13 DIAGNOSIS — J3089 Other allergic rhinitis: Secondary | ICD-10-CM | POA: Diagnosis not present

## 2023-03-13 DIAGNOSIS — J301 Allergic rhinitis due to pollen: Secondary | ICD-10-CM | POA: Diagnosis not present

## 2023-03-13 DIAGNOSIS — J3081 Allergic rhinitis due to animal (cat) (dog) hair and dander: Secondary | ICD-10-CM | POA: Diagnosis not present

## 2023-03-16 DIAGNOSIS — M436 Torticollis: Secondary | ICD-10-CM | POA: Diagnosis not present

## 2023-03-16 DIAGNOSIS — K2100 Gastro-esophageal reflux disease with esophagitis, without bleeding: Secondary | ICD-10-CM | POA: Diagnosis not present

## 2023-03-20 DIAGNOSIS — J3089 Other allergic rhinitis: Secondary | ICD-10-CM | POA: Diagnosis not present

## 2023-03-20 DIAGNOSIS — J3081 Allergic rhinitis due to animal (cat) (dog) hair and dander: Secondary | ICD-10-CM | POA: Diagnosis not present

## 2023-03-20 DIAGNOSIS — J301 Allergic rhinitis due to pollen: Secondary | ICD-10-CM | POA: Diagnosis not present

## 2023-03-27 DIAGNOSIS — J301 Allergic rhinitis due to pollen: Secondary | ICD-10-CM | POA: Diagnosis not present

## 2023-03-27 DIAGNOSIS — J3089 Other allergic rhinitis: Secondary | ICD-10-CM | POA: Diagnosis not present

## 2023-03-27 DIAGNOSIS — J3081 Allergic rhinitis due to animal (cat) (dog) hair and dander: Secondary | ICD-10-CM | POA: Diagnosis not present

## 2023-03-30 DIAGNOSIS — J302 Other seasonal allergic rhinitis: Secondary | ICD-10-CM | POA: Diagnosis not present

## 2023-03-30 DIAGNOSIS — J029 Acute pharyngitis, unspecified: Secondary | ICD-10-CM | POA: Diagnosis not present

## 2023-03-30 DIAGNOSIS — J0100 Acute maxillary sinusitis, unspecified: Secondary | ICD-10-CM | POA: Diagnosis not present

## 2023-04-03 DIAGNOSIS — J3089 Other allergic rhinitis: Secondary | ICD-10-CM | POA: Diagnosis not present

## 2023-04-03 DIAGNOSIS — J3081 Allergic rhinitis due to animal (cat) (dog) hair and dander: Secondary | ICD-10-CM | POA: Diagnosis not present

## 2023-04-03 DIAGNOSIS — J301 Allergic rhinitis due to pollen: Secondary | ICD-10-CM | POA: Diagnosis not present

## 2023-04-03 DIAGNOSIS — M436 Torticollis: Secondary | ICD-10-CM | POA: Diagnosis not present

## 2023-04-03 DIAGNOSIS — Q680 Congenital deformity of sternocleidomastoid muscle: Secondary | ICD-10-CM | POA: Diagnosis not present

## 2023-04-10 DIAGNOSIS — J3089 Other allergic rhinitis: Secondary | ICD-10-CM | POA: Diagnosis not present

## 2023-04-10 DIAGNOSIS — J3081 Allergic rhinitis due to animal (cat) (dog) hair and dander: Secondary | ICD-10-CM | POA: Diagnosis not present

## 2023-04-10 DIAGNOSIS — J301 Allergic rhinitis due to pollen: Secondary | ICD-10-CM | POA: Diagnosis not present

## 2023-04-11 DIAGNOSIS — K2100 Gastro-esophageal reflux disease with esophagitis, without bleeding: Secondary | ICD-10-CM | POA: Diagnosis not present

## 2023-04-17 DIAGNOSIS — J3089 Other allergic rhinitis: Secondary | ICD-10-CM | POA: Diagnosis not present

## 2023-04-17 DIAGNOSIS — J301 Allergic rhinitis due to pollen: Secondary | ICD-10-CM | POA: Diagnosis not present

## 2023-04-17 DIAGNOSIS — J3081 Allergic rhinitis due to animal (cat) (dog) hair and dander: Secondary | ICD-10-CM | POA: Diagnosis not present

## 2023-04-19 DIAGNOSIS — Q680 Congenital deformity of sternocleidomastoid muscle: Secondary | ICD-10-CM | POA: Diagnosis not present

## 2023-04-19 DIAGNOSIS — M436 Torticollis: Secondary | ICD-10-CM | POA: Diagnosis not present

## 2023-04-24 DIAGNOSIS — J3089 Other allergic rhinitis: Secondary | ICD-10-CM | POA: Diagnosis not present

## 2023-04-24 DIAGNOSIS — J3081 Allergic rhinitis due to animal (cat) (dog) hair and dander: Secondary | ICD-10-CM | POA: Diagnosis not present

## 2023-04-24 DIAGNOSIS — J301 Allergic rhinitis due to pollen: Secondary | ICD-10-CM | POA: Diagnosis not present

## 2023-04-26 DIAGNOSIS — M436 Torticollis: Secondary | ICD-10-CM | POA: Diagnosis not present

## 2023-04-26 DIAGNOSIS — Q680 Congenital deformity of sternocleidomastoid muscle: Secondary | ICD-10-CM | POA: Diagnosis not present

## 2023-05-01 DIAGNOSIS — J3081 Allergic rhinitis due to animal (cat) (dog) hair and dander: Secondary | ICD-10-CM | POA: Diagnosis not present

## 2023-05-01 DIAGNOSIS — J3089 Other allergic rhinitis: Secondary | ICD-10-CM | POA: Diagnosis not present

## 2023-05-01 DIAGNOSIS — J301 Allergic rhinitis due to pollen: Secondary | ICD-10-CM | POA: Diagnosis not present

## 2023-05-02 DIAGNOSIS — Q680 Congenital deformity of sternocleidomastoid muscle: Secondary | ICD-10-CM | POA: Diagnosis not present

## 2023-05-02 DIAGNOSIS — M436 Torticollis: Secondary | ICD-10-CM | POA: Diagnosis not present

## 2023-05-08 DIAGNOSIS — J301 Allergic rhinitis due to pollen: Secondary | ICD-10-CM | POA: Diagnosis not present

## 2023-05-08 DIAGNOSIS — J3089 Other allergic rhinitis: Secondary | ICD-10-CM | POA: Diagnosis not present

## 2023-05-08 DIAGNOSIS — J3081 Allergic rhinitis due to animal (cat) (dog) hair and dander: Secondary | ICD-10-CM | POA: Diagnosis not present

## 2023-05-09 DIAGNOSIS — Z1389 Encounter for screening for other disorder: Secondary | ICD-10-CM | POA: Diagnosis not present

## 2023-05-09 DIAGNOSIS — F419 Anxiety disorder, unspecified: Secondary | ICD-10-CM | POA: Diagnosis not present

## 2023-05-09 DIAGNOSIS — E663 Overweight: Secondary | ICD-10-CM | POA: Diagnosis not present

## 2023-05-10 DIAGNOSIS — Q680 Congenital deformity of sternocleidomastoid muscle: Secondary | ICD-10-CM | POA: Diagnosis not present

## 2023-05-10 DIAGNOSIS — M436 Torticollis: Secondary | ICD-10-CM | POA: Diagnosis not present

## 2023-05-15 DIAGNOSIS — J3089 Other allergic rhinitis: Secondary | ICD-10-CM | POA: Diagnosis not present

## 2023-05-15 DIAGNOSIS — J301 Allergic rhinitis due to pollen: Secondary | ICD-10-CM | POA: Diagnosis not present

## 2023-05-15 DIAGNOSIS — J3081 Allergic rhinitis due to animal (cat) (dog) hair and dander: Secondary | ICD-10-CM | POA: Diagnosis not present

## 2023-05-16 DIAGNOSIS — R030 Elevated blood-pressure reading, without diagnosis of hypertension: Secondary | ICD-10-CM | POA: Diagnosis not present

## 2023-05-16 DIAGNOSIS — Q680 Congenital deformity of sternocleidomastoid muscle: Secondary | ICD-10-CM | POA: Diagnosis not present

## 2023-05-16 DIAGNOSIS — M436 Torticollis: Secondary | ICD-10-CM | POA: Diagnosis not present

## 2023-05-16 DIAGNOSIS — Z0000 Encounter for general adult medical examination without abnormal findings: Secondary | ICD-10-CM | POA: Diagnosis not present

## 2023-05-16 DIAGNOSIS — Z1331 Encounter for screening for depression: Secondary | ICD-10-CM | POA: Diagnosis not present

## 2023-05-16 DIAGNOSIS — Z1339 Encounter for screening examination for other mental health and behavioral disorders: Secondary | ICD-10-CM | POA: Diagnosis not present

## 2023-05-22 DIAGNOSIS — J3081 Allergic rhinitis due to animal (cat) (dog) hair and dander: Secondary | ICD-10-CM | POA: Diagnosis not present

## 2023-05-22 DIAGNOSIS — J3089 Other allergic rhinitis: Secondary | ICD-10-CM | POA: Diagnosis not present

## 2023-05-22 DIAGNOSIS — J301 Allergic rhinitis due to pollen: Secondary | ICD-10-CM | POA: Diagnosis not present

## 2023-05-24 DIAGNOSIS — M436 Torticollis: Secondary | ICD-10-CM | POA: Diagnosis not present

## 2023-05-24 DIAGNOSIS — Q680 Congenital deformity of sternocleidomastoid muscle: Secondary | ICD-10-CM | POA: Diagnosis not present

## 2023-05-29 DIAGNOSIS — J3081 Allergic rhinitis due to animal (cat) (dog) hair and dander: Secondary | ICD-10-CM | POA: Diagnosis not present

## 2023-05-29 DIAGNOSIS — J3089 Other allergic rhinitis: Secondary | ICD-10-CM | POA: Diagnosis not present

## 2023-05-29 DIAGNOSIS — J301 Allergic rhinitis due to pollen: Secondary | ICD-10-CM | POA: Diagnosis not present

## 2023-06-05 DIAGNOSIS — J3089 Other allergic rhinitis: Secondary | ICD-10-CM | POA: Diagnosis not present

## 2023-06-05 DIAGNOSIS — J301 Allergic rhinitis due to pollen: Secondary | ICD-10-CM | POA: Diagnosis not present

## 2023-06-05 DIAGNOSIS — J3081 Allergic rhinitis due to animal (cat) (dog) hair and dander: Secondary | ICD-10-CM | POA: Diagnosis not present

## 2023-06-12 DIAGNOSIS — J3081 Allergic rhinitis due to animal (cat) (dog) hair and dander: Secondary | ICD-10-CM | POA: Diagnosis not present

## 2023-06-12 DIAGNOSIS — J301 Allergic rhinitis due to pollen: Secondary | ICD-10-CM | POA: Diagnosis not present

## 2023-06-12 DIAGNOSIS — J3089 Other allergic rhinitis: Secondary | ICD-10-CM | POA: Diagnosis not present

## 2023-06-19 DIAGNOSIS — J3081 Allergic rhinitis due to animal (cat) (dog) hair and dander: Secondary | ICD-10-CM | POA: Diagnosis not present

## 2023-06-19 DIAGNOSIS — J3089 Other allergic rhinitis: Secondary | ICD-10-CM | POA: Diagnosis not present

## 2023-06-19 DIAGNOSIS — J301 Allergic rhinitis due to pollen: Secondary | ICD-10-CM | POA: Diagnosis not present

## 2023-06-26 DIAGNOSIS — J3089 Other allergic rhinitis: Secondary | ICD-10-CM | POA: Diagnosis not present

## 2023-06-26 DIAGNOSIS — J3081 Allergic rhinitis due to animal (cat) (dog) hair and dander: Secondary | ICD-10-CM | POA: Diagnosis not present

## 2023-06-26 DIAGNOSIS — J301 Allergic rhinitis due to pollen: Secondary | ICD-10-CM | POA: Diagnosis not present

## 2023-07-03 DIAGNOSIS — J3089 Other allergic rhinitis: Secondary | ICD-10-CM | POA: Diagnosis not present

## 2023-07-03 DIAGNOSIS — J3081 Allergic rhinitis due to animal (cat) (dog) hair and dander: Secondary | ICD-10-CM | POA: Diagnosis not present

## 2023-07-03 DIAGNOSIS — J301 Allergic rhinitis due to pollen: Secondary | ICD-10-CM | POA: Diagnosis not present

## 2023-07-10 DIAGNOSIS — J3081 Allergic rhinitis due to animal (cat) (dog) hair and dander: Secondary | ICD-10-CM | POA: Diagnosis not present

## 2023-07-10 DIAGNOSIS — J301 Allergic rhinitis due to pollen: Secondary | ICD-10-CM | POA: Diagnosis not present

## 2023-07-10 DIAGNOSIS — J3089 Other allergic rhinitis: Secondary | ICD-10-CM | POA: Diagnosis not present

## 2023-07-16 DIAGNOSIS — J301 Allergic rhinitis due to pollen: Secondary | ICD-10-CM | POA: Diagnosis not present

## 2023-07-16 DIAGNOSIS — J3081 Allergic rhinitis due to animal (cat) (dog) hair and dander: Secondary | ICD-10-CM | POA: Diagnosis not present

## 2023-07-16 DIAGNOSIS — J3089 Other allergic rhinitis: Secondary | ICD-10-CM | POA: Diagnosis not present

## 2023-07-23 DIAGNOSIS — J301 Allergic rhinitis due to pollen: Secondary | ICD-10-CM | POA: Diagnosis not present

## 2023-07-23 DIAGNOSIS — J3081 Allergic rhinitis due to animal (cat) (dog) hair and dander: Secondary | ICD-10-CM | POA: Diagnosis not present

## 2023-07-23 DIAGNOSIS — J3089 Other allergic rhinitis: Secondary | ICD-10-CM | POA: Diagnosis not present

## 2023-07-31 DIAGNOSIS — J3089 Other allergic rhinitis: Secondary | ICD-10-CM | POA: Diagnosis not present

## 2023-07-31 DIAGNOSIS — J301 Allergic rhinitis due to pollen: Secondary | ICD-10-CM | POA: Diagnosis not present

## 2023-07-31 DIAGNOSIS — J3081 Allergic rhinitis due to animal (cat) (dog) hair and dander: Secondary | ICD-10-CM | POA: Diagnosis not present

## 2023-08-07 DIAGNOSIS — J3081 Allergic rhinitis due to animal (cat) (dog) hair and dander: Secondary | ICD-10-CM | POA: Diagnosis not present

## 2023-08-07 DIAGNOSIS — J3089 Other allergic rhinitis: Secondary | ICD-10-CM | POA: Diagnosis not present

## 2023-08-07 DIAGNOSIS — J301 Allergic rhinitis due to pollen: Secondary | ICD-10-CM | POA: Diagnosis not present

## 2023-08-14 DIAGNOSIS — J301 Allergic rhinitis due to pollen: Secondary | ICD-10-CM | POA: Diagnosis not present

## 2023-08-14 DIAGNOSIS — J3081 Allergic rhinitis due to animal (cat) (dog) hair and dander: Secondary | ICD-10-CM | POA: Diagnosis not present

## 2023-08-14 DIAGNOSIS — J3089 Other allergic rhinitis: Secondary | ICD-10-CM | POA: Diagnosis not present

## 2023-08-21 DIAGNOSIS — J3089 Other allergic rhinitis: Secondary | ICD-10-CM | POA: Diagnosis not present

## 2023-08-21 DIAGNOSIS — J301 Allergic rhinitis due to pollen: Secondary | ICD-10-CM | POA: Diagnosis not present

## 2023-08-21 DIAGNOSIS — J3081 Allergic rhinitis due to animal (cat) (dog) hair and dander: Secondary | ICD-10-CM | POA: Diagnosis not present

## 2023-08-28 DIAGNOSIS — J301 Allergic rhinitis due to pollen: Secondary | ICD-10-CM | POA: Diagnosis not present

## 2023-08-28 DIAGNOSIS — J3089 Other allergic rhinitis: Secondary | ICD-10-CM | POA: Diagnosis not present

## 2023-08-28 DIAGNOSIS — J3081 Allergic rhinitis due to animal (cat) (dog) hair and dander: Secondary | ICD-10-CM | POA: Diagnosis not present

## 2023-09-04 DIAGNOSIS — J301 Allergic rhinitis due to pollen: Secondary | ICD-10-CM | POA: Diagnosis not present

## 2023-09-04 DIAGNOSIS — J3081 Allergic rhinitis due to animal (cat) (dog) hair and dander: Secondary | ICD-10-CM | POA: Diagnosis not present

## 2023-09-04 DIAGNOSIS — J3089 Other allergic rhinitis: Secondary | ICD-10-CM | POA: Diagnosis not present

## 2023-09-11 DIAGNOSIS — J3081 Allergic rhinitis due to animal (cat) (dog) hair and dander: Secondary | ICD-10-CM | POA: Diagnosis not present

## 2023-09-11 DIAGNOSIS — J301 Allergic rhinitis due to pollen: Secondary | ICD-10-CM | POA: Diagnosis not present

## 2023-09-11 DIAGNOSIS — J3089 Other allergic rhinitis: Secondary | ICD-10-CM | POA: Diagnosis not present

## 2023-09-20 DIAGNOSIS — J3081 Allergic rhinitis due to animal (cat) (dog) hair and dander: Secondary | ICD-10-CM | POA: Diagnosis not present

## 2023-09-20 DIAGNOSIS — J301 Allergic rhinitis due to pollen: Secondary | ICD-10-CM | POA: Diagnosis not present

## 2023-09-20 DIAGNOSIS — J3089 Other allergic rhinitis: Secondary | ICD-10-CM | POA: Diagnosis not present

## 2023-09-24 DIAGNOSIS — J029 Acute pharyngitis, unspecified: Secondary | ICD-10-CM | POA: Diagnosis not present

## 2023-09-24 DIAGNOSIS — J020 Streptococcal pharyngitis: Secondary | ICD-10-CM | POA: Diagnosis not present

## 2023-09-24 DIAGNOSIS — G43909 Migraine, unspecified, not intractable, without status migrainosus: Secondary | ICD-10-CM | POA: Diagnosis not present

## 2023-10-04 DIAGNOSIS — J3089 Other allergic rhinitis: Secondary | ICD-10-CM | POA: Diagnosis not present

## 2023-10-04 DIAGNOSIS — J301 Allergic rhinitis due to pollen: Secondary | ICD-10-CM | POA: Diagnosis not present

## 2023-10-04 DIAGNOSIS — J3081 Allergic rhinitis due to animal (cat) (dog) hair and dander: Secondary | ICD-10-CM | POA: Diagnosis not present

## 2023-10-09 DIAGNOSIS — J3081 Allergic rhinitis due to animal (cat) (dog) hair and dander: Secondary | ICD-10-CM | POA: Diagnosis not present

## 2023-10-09 DIAGNOSIS — J3089 Other allergic rhinitis: Secondary | ICD-10-CM | POA: Diagnosis not present

## 2023-10-09 DIAGNOSIS — J301 Allergic rhinitis due to pollen: Secondary | ICD-10-CM | POA: Diagnosis not present

## 2023-10-11 DIAGNOSIS — J029 Acute pharyngitis, unspecified: Secondary | ICD-10-CM | POA: Diagnosis not present

## 2023-10-11 DIAGNOSIS — R0981 Nasal congestion: Secondary | ICD-10-CM | POA: Diagnosis not present

## 2023-10-16 DIAGNOSIS — J301 Allergic rhinitis due to pollen: Secondary | ICD-10-CM | POA: Diagnosis not present

## 2023-10-16 DIAGNOSIS — J3089 Other allergic rhinitis: Secondary | ICD-10-CM | POA: Diagnosis not present

## 2023-10-16 DIAGNOSIS — J3081 Allergic rhinitis due to animal (cat) (dog) hair and dander: Secondary | ICD-10-CM | POA: Diagnosis not present

## 2023-10-23 DIAGNOSIS — J3081 Allergic rhinitis due to animal (cat) (dog) hair and dander: Secondary | ICD-10-CM | POA: Diagnosis not present

## 2023-10-23 DIAGNOSIS — J301 Allergic rhinitis due to pollen: Secondary | ICD-10-CM | POA: Diagnosis not present

## 2023-10-23 DIAGNOSIS — J3089 Other allergic rhinitis: Secondary | ICD-10-CM | POA: Diagnosis not present

## 2023-10-30 DIAGNOSIS — J3081 Allergic rhinitis due to animal (cat) (dog) hair and dander: Secondary | ICD-10-CM | POA: Diagnosis not present

## 2023-10-30 DIAGNOSIS — J301 Allergic rhinitis due to pollen: Secondary | ICD-10-CM | POA: Diagnosis not present

## 2023-10-30 DIAGNOSIS — J3089 Other allergic rhinitis: Secondary | ICD-10-CM | POA: Diagnosis not present

## 2023-11-06 DIAGNOSIS — J301 Allergic rhinitis due to pollen: Secondary | ICD-10-CM | POA: Diagnosis not present

## 2023-11-06 DIAGNOSIS — J3081 Allergic rhinitis due to animal (cat) (dog) hair and dander: Secondary | ICD-10-CM | POA: Diagnosis not present

## 2023-11-06 DIAGNOSIS — J3089 Other allergic rhinitis: Secondary | ICD-10-CM | POA: Diagnosis not present

## 2023-11-13 DIAGNOSIS — J3081 Allergic rhinitis due to animal (cat) (dog) hair and dander: Secondary | ICD-10-CM | POA: Diagnosis not present

## 2023-11-13 DIAGNOSIS — J3089 Other allergic rhinitis: Secondary | ICD-10-CM | POA: Diagnosis not present

## 2023-11-13 DIAGNOSIS — J301 Allergic rhinitis due to pollen: Secondary | ICD-10-CM | POA: Diagnosis not present

## 2023-11-14 DIAGNOSIS — J3089 Other allergic rhinitis: Secondary | ICD-10-CM | POA: Diagnosis not present

## 2023-11-20 DIAGNOSIS — J3081 Allergic rhinitis due to animal (cat) (dog) hair and dander: Secondary | ICD-10-CM | POA: Diagnosis not present

## 2023-11-20 DIAGNOSIS — J301 Allergic rhinitis due to pollen: Secondary | ICD-10-CM | POA: Diagnosis not present

## 2023-11-20 DIAGNOSIS — J3089 Other allergic rhinitis: Secondary | ICD-10-CM | POA: Diagnosis not present

## 2023-11-27 DIAGNOSIS — J3089 Other allergic rhinitis: Secondary | ICD-10-CM | POA: Diagnosis not present

## 2023-11-27 DIAGNOSIS — J301 Allergic rhinitis due to pollen: Secondary | ICD-10-CM | POA: Diagnosis not present

## 2023-11-27 DIAGNOSIS — J3081 Allergic rhinitis due to animal (cat) (dog) hair and dander: Secondary | ICD-10-CM | POA: Diagnosis not present

## 2023-12-04 DIAGNOSIS — J3089 Other allergic rhinitis: Secondary | ICD-10-CM | POA: Diagnosis not present

## 2023-12-04 DIAGNOSIS — J3081 Allergic rhinitis due to animal (cat) (dog) hair and dander: Secondary | ICD-10-CM | POA: Diagnosis not present

## 2023-12-04 DIAGNOSIS — J301 Allergic rhinitis due to pollen: Secondary | ICD-10-CM | POA: Diagnosis not present

## 2023-12-11 DIAGNOSIS — J3089 Other allergic rhinitis: Secondary | ICD-10-CM | POA: Diagnosis not present

## 2023-12-11 DIAGNOSIS — J3081 Allergic rhinitis due to animal (cat) (dog) hair and dander: Secondary | ICD-10-CM | POA: Diagnosis not present

## 2023-12-11 DIAGNOSIS — J301 Allergic rhinitis due to pollen: Secondary | ICD-10-CM | POA: Diagnosis not present

## 2023-12-18 DIAGNOSIS — J301 Allergic rhinitis due to pollen: Secondary | ICD-10-CM | POA: Diagnosis not present

## 2023-12-18 DIAGNOSIS — J3089 Other allergic rhinitis: Secondary | ICD-10-CM | POA: Diagnosis not present

## 2023-12-18 DIAGNOSIS — J3081 Allergic rhinitis due to animal (cat) (dog) hair and dander: Secondary | ICD-10-CM | POA: Diagnosis not present

## 2023-12-25 DIAGNOSIS — J3089 Other allergic rhinitis: Secondary | ICD-10-CM | POA: Diagnosis not present

## 2023-12-25 DIAGNOSIS — J301 Allergic rhinitis due to pollen: Secondary | ICD-10-CM | POA: Diagnosis not present

## 2023-12-25 DIAGNOSIS — J3081 Allergic rhinitis due to animal (cat) (dog) hair and dander: Secondary | ICD-10-CM | POA: Diagnosis not present

## 2024-01-01 DIAGNOSIS — J3089 Other allergic rhinitis: Secondary | ICD-10-CM | POA: Diagnosis not present

## 2024-01-01 DIAGNOSIS — J3081 Allergic rhinitis due to animal (cat) (dog) hair and dander: Secondary | ICD-10-CM | POA: Diagnosis not present

## 2024-01-01 DIAGNOSIS — J301 Allergic rhinitis due to pollen: Secondary | ICD-10-CM | POA: Diagnosis not present

## 2024-01-08 DIAGNOSIS — J301 Allergic rhinitis due to pollen: Secondary | ICD-10-CM | POA: Diagnosis not present

## 2024-01-08 DIAGNOSIS — J3089 Other allergic rhinitis: Secondary | ICD-10-CM | POA: Diagnosis not present

## 2024-01-08 DIAGNOSIS — J3081 Allergic rhinitis due to animal (cat) (dog) hair and dander: Secondary | ICD-10-CM | POA: Diagnosis not present

## 2024-01-15 DIAGNOSIS — J3089 Other allergic rhinitis: Secondary | ICD-10-CM | POA: Diagnosis not present

## 2024-01-15 DIAGNOSIS — J301 Allergic rhinitis due to pollen: Secondary | ICD-10-CM | POA: Diagnosis not present

## 2024-01-15 DIAGNOSIS — J3081 Allergic rhinitis due to animal (cat) (dog) hair and dander: Secondary | ICD-10-CM | POA: Diagnosis not present

## 2024-01-22 DIAGNOSIS — J3081 Allergic rhinitis due to animal (cat) (dog) hair and dander: Secondary | ICD-10-CM | POA: Diagnosis not present

## 2024-01-22 DIAGNOSIS — J301 Allergic rhinitis due to pollen: Secondary | ICD-10-CM | POA: Diagnosis not present

## 2024-01-22 DIAGNOSIS — J3089 Other allergic rhinitis: Secondary | ICD-10-CM | POA: Diagnosis not present

## 2024-01-29 DIAGNOSIS — J301 Allergic rhinitis due to pollen: Secondary | ICD-10-CM | POA: Diagnosis not present

## 2024-01-29 DIAGNOSIS — J3089 Other allergic rhinitis: Secondary | ICD-10-CM | POA: Diagnosis not present

## 2024-01-29 DIAGNOSIS — J3081 Allergic rhinitis due to animal (cat) (dog) hair and dander: Secondary | ICD-10-CM | POA: Diagnosis not present

## 2024-02-07 DIAGNOSIS — J3081 Allergic rhinitis due to animal (cat) (dog) hair and dander: Secondary | ICD-10-CM | POA: Diagnosis not present

## 2024-02-07 DIAGNOSIS — J3089 Other allergic rhinitis: Secondary | ICD-10-CM | POA: Diagnosis not present

## 2024-02-07 DIAGNOSIS — J301 Allergic rhinitis due to pollen: Secondary | ICD-10-CM | POA: Diagnosis not present

## 2024-02-19 DIAGNOSIS — J3089 Other allergic rhinitis: Secondary | ICD-10-CM | POA: Diagnosis not present

## 2024-02-19 DIAGNOSIS — J3081 Allergic rhinitis due to animal (cat) (dog) hair and dander: Secondary | ICD-10-CM | POA: Diagnosis not present

## 2024-02-19 DIAGNOSIS — J301 Allergic rhinitis due to pollen: Secondary | ICD-10-CM | POA: Diagnosis not present

## 2024-02-26 DIAGNOSIS — J301 Allergic rhinitis due to pollen: Secondary | ICD-10-CM | POA: Diagnosis not present

## 2024-02-26 DIAGNOSIS — J3089 Other allergic rhinitis: Secondary | ICD-10-CM | POA: Diagnosis not present

## 2024-02-26 DIAGNOSIS — J3081 Allergic rhinitis due to animal (cat) (dog) hair and dander: Secondary | ICD-10-CM | POA: Diagnosis not present

## 2024-03-04 DIAGNOSIS — J3089 Other allergic rhinitis: Secondary | ICD-10-CM | POA: Diagnosis not present

## 2024-03-04 DIAGNOSIS — J3081 Allergic rhinitis due to animal (cat) (dog) hair and dander: Secondary | ICD-10-CM | POA: Diagnosis not present

## 2024-03-04 DIAGNOSIS — J301 Allergic rhinitis due to pollen: Secondary | ICD-10-CM | POA: Diagnosis not present

## 2024-03-11 DIAGNOSIS — J3081 Allergic rhinitis due to animal (cat) (dog) hair and dander: Secondary | ICD-10-CM | POA: Diagnosis not present

## 2024-03-11 DIAGNOSIS — J301 Allergic rhinitis due to pollen: Secondary | ICD-10-CM | POA: Diagnosis not present

## 2024-03-11 DIAGNOSIS — J3089 Other allergic rhinitis: Secondary | ICD-10-CM | POA: Diagnosis not present

## 2024-03-13 DIAGNOSIS — R1031 Right lower quadrant pain: Secondary | ICD-10-CM | POA: Diagnosis not present

## 2024-03-13 DIAGNOSIS — R110 Nausea: Secondary | ICD-10-CM | POA: Diagnosis not present

## 2024-03-13 DIAGNOSIS — K5730 Diverticulosis of large intestine without perforation or abscess without bleeding: Secondary | ICD-10-CM | POA: Diagnosis not present

## 2024-03-14 DIAGNOSIS — R1031 Right lower quadrant pain: Secondary | ICD-10-CM | POA: Diagnosis not present

## 2024-03-19 DIAGNOSIS — J3081 Allergic rhinitis due to animal (cat) (dog) hair and dander: Secondary | ICD-10-CM | POA: Diagnosis not present

## 2024-03-19 DIAGNOSIS — J301 Allergic rhinitis due to pollen: Secondary | ICD-10-CM | POA: Diagnosis not present

## 2024-03-19 DIAGNOSIS — J3089 Other allergic rhinitis: Secondary | ICD-10-CM | POA: Diagnosis not present

## 2024-03-19 DIAGNOSIS — H1045 Other chronic allergic conjunctivitis: Secondary | ICD-10-CM | POA: Diagnosis not present

## 2024-03-25 DIAGNOSIS — M25551 Pain in right hip: Secondary | ICD-10-CM | POA: Diagnosis not present

## 2024-03-25 DIAGNOSIS — Z0189 Encounter for other specified special examinations: Secondary | ICD-10-CM | POA: Diagnosis not present

## 2024-03-26 DIAGNOSIS — J301 Allergic rhinitis due to pollen: Secondary | ICD-10-CM | POA: Diagnosis not present

## 2024-03-26 DIAGNOSIS — J3089 Other allergic rhinitis: Secondary | ICD-10-CM | POA: Diagnosis not present

## 2024-03-26 DIAGNOSIS — J3081 Allergic rhinitis due to animal (cat) (dog) hair and dander: Secondary | ICD-10-CM | POA: Diagnosis not present

## 2024-04-01 DIAGNOSIS — J301 Allergic rhinitis due to pollen: Secondary | ICD-10-CM | POA: Diagnosis not present

## 2024-04-01 DIAGNOSIS — J3089 Other allergic rhinitis: Secondary | ICD-10-CM | POA: Diagnosis not present

## 2024-04-01 DIAGNOSIS — J3081 Allergic rhinitis due to animal (cat) (dog) hair and dander: Secondary | ICD-10-CM | POA: Diagnosis not present

## 2024-04-08 DIAGNOSIS — J301 Allergic rhinitis due to pollen: Secondary | ICD-10-CM | POA: Diagnosis not present

## 2024-04-08 DIAGNOSIS — J3081 Allergic rhinitis due to animal (cat) (dog) hair and dander: Secondary | ICD-10-CM | POA: Diagnosis not present

## 2024-04-08 DIAGNOSIS — J3089 Other allergic rhinitis: Secondary | ICD-10-CM | POA: Diagnosis not present

## 2024-04-09 DIAGNOSIS — M25551 Pain in right hip: Secondary | ICD-10-CM | POA: Diagnosis not present

## 2024-04-14 DIAGNOSIS — M25551 Pain in right hip: Secondary | ICD-10-CM | POA: Diagnosis not present

## 2024-04-15 DIAGNOSIS — J3081 Allergic rhinitis due to animal (cat) (dog) hair and dander: Secondary | ICD-10-CM | POA: Diagnosis not present

## 2024-04-15 DIAGNOSIS — J301 Allergic rhinitis due to pollen: Secondary | ICD-10-CM | POA: Diagnosis not present

## 2024-04-15 DIAGNOSIS — J3089 Other allergic rhinitis: Secondary | ICD-10-CM | POA: Diagnosis not present

## 2024-04-21 DIAGNOSIS — S76011D Strain of muscle, fascia and tendon of right hip, subsequent encounter: Secondary | ICD-10-CM | POA: Diagnosis not present

## 2024-04-22 DIAGNOSIS — J301 Allergic rhinitis due to pollen: Secondary | ICD-10-CM | POA: Diagnosis not present

## 2024-04-22 DIAGNOSIS — J3089 Other allergic rhinitis: Secondary | ICD-10-CM | POA: Diagnosis not present

## 2024-04-22 DIAGNOSIS — J3081 Allergic rhinitis due to animal (cat) (dog) hair and dander: Secondary | ICD-10-CM | POA: Diagnosis not present

## 2024-04-24 DIAGNOSIS — S76011D Strain of muscle, fascia and tendon of right hip, subsequent encounter: Secondary | ICD-10-CM | POA: Diagnosis not present

## 2024-04-29 DIAGNOSIS — J3089 Other allergic rhinitis: Secondary | ICD-10-CM | POA: Diagnosis not present

## 2024-04-29 DIAGNOSIS — J3081 Allergic rhinitis due to animal (cat) (dog) hair and dander: Secondary | ICD-10-CM | POA: Diagnosis not present

## 2024-04-29 DIAGNOSIS — J301 Allergic rhinitis due to pollen: Secondary | ICD-10-CM | POA: Diagnosis not present

## 2024-05-05 DIAGNOSIS — S76122D Laceration of left quadriceps muscle, fascia and tendon, subsequent encounter: Secondary | ICD-10-CM | POA: Diagnosis not present

## 2024-05-05 DIAGNOSIS — R531 Weakness: Secondary | ICD-10-CM | POA: Diagnosis not present

## 2024-05-05 DIAGNOSIS — R2689 Other abnormalities of gait and mobility: Secondary | ICD-10-CM | POA: Diagnosis not present

## 2024-05-05 DIAGNOSIS — M25562 Pain in left knee: Secondary | ICD-10-CM | POA: Diagnosis not present

## 2024-05-06 DIAGNOSIS — J3081 Allergic rhinitis due to animal (cat) (dog) hair and dander: Secondary | ICD-10-CM | POA: Diagnosis not present

## 2024-05-06 DIAGNOSIS — J3089 Other allergic rhinitis: Secondary | ICD-10-CM | POA: Diagnosis not present

## 2024-05-06 DIAGNOSIS — J301 Allergic rhinitis due to pollen: Secondary | ICD-10-CM | POA: Diagnosis not present

## 2024-05-08 DIAGNOSIS — J301 Allergic rhinitis due to pollen: Secondary | ICD-10-CM | POA: Diagnosis not present

## 2024-05-08 DIAGNOSIS — J3089 Other allergic rhinitis: Secondary | ICD-10-CM | POA: Diagnosis not present

## 2024-05-08 DIAGNOSIS — J3081 Allergic rhinitis due to animal (cat) (dog) hair and dander: Secondary | ICD-10-CM | POA: Diagnosis not present

## 2024-05-09 DIAGNOSIS — J3089 Other allergic rhinitis: Secondary | ICD-10-CM | POA: Diagnosis not present

## 2024-05-09 DIAGNOSIS — J301 Allergic rhinitis due to pollen: Secondary | ICD-10-CM | POA: Diagnosis not present

## 2024-05-09 DIAGNOSIS — J3081 Allergic rhinitis due to animal (cat) (dog) hair and dander: Secondary | ICD-10-CM | POA: Diagnosis not present

## 2024-05-12 DIAGNOSIS — Z1389 Encounter for screening for other disorder: Secondary | ICD-10-CM | POA: Diagnosis not present

## 2024-05-12 DIAGNOSIS — R030 Elevated blood-pressure reading, without diagnosis of hypertension: Secondary | ICD-10-CM | POA: Diagnosis not present

## 2024-05-12 DIAGNOSIS — M1611 Unilateral primary osteoarthritis, right hip: Secondary | ICD-10-CM | POA: Diagnosis not present

## 2024-05-12 DIAGNOSIS — Z1329 Encounter for screening for other suspected endocrine disorder: Secondary | ICD-10-CM | POA: Diagnosis not present

## 2024-05-19 DIAGNOSIS — Z1331 Encounter for screening for depression: Secondary | ICD-10-CM | POA: Diagnosis not present

## 2024-05-19 DIAGNOSIS — Z0000 Encounter for general adult medical examination without abnormal findings: Secondary | ICD-10-CM | POA: Diagnosis not present

## 2024-05-20 DIAGNOSIS — J3081 Allergic rhinitis due to animal (cat) (dog) hair and dander: Secondary | ICD-10-CM | POA: Diagnosis not present

## 2024-05-20 DIAGNOSIS — J301 Allergic rhinitis due to pollen: Secondary | ICD-10-CM | POA: Diagnosis not present

## 2024-05-20 DIAGNOSIS — J3089 Other allergic rhinitis: Secondary | ICD-10-CM | POA: Diagnosis not present

## 2024-05-27 DIAGNOSIS — J3081 Allergic rhinitis due to animal (cat) (dog) hair and dander: Secondary | ICD-10-CM | POA: Diagnosis not present

## 2024-05-27 DIAGNOSIS — J301 Allergic rhinitis due to pollen: Secondary | ICD-10-CM | POA: Diagnosis not present

## 2024-05-27 DIAGNOSIS — J3089 Other allergic rhinitis: Secondary | ICD-10-CM | POA: Diagnosis not present

## 2024-06-03 DIAGNOSIS — J3089 Other allergic rhinitis: Secondary | ICD-10-CM | POA: Diagnosis not present

## 2024-06-03 DIAGNOSIS — J301 Allergic rhinitis due to pollen: Secondary | ICD-10-CM | POA: Diagnosis not present

## 2024-06-03 DIAGNOSIS — J3081 Allergic rhinitis due to animal (cat) (dog) hair and dander: Secondary | ICD-10-CM | POA: Diagnosis not present

## 2024-06-10 DIAGNOSIS — J301 Allergic rhinitis due to pollen: Secondary | ICD-10-CM | POA: Diagnosis not present

## 2024-06-10 DIAGNOSIS — J3081 Allergic rhinitis due to animal (cat) (dog) hair and dander: Secondary | ICD-10-CM | POA: Diagnosis not present

## 2024-06-10 DIAGNOSIS — J3089 Other allergic rhinitis: Secondary | ICD-10-CM | POA: Diagnosis not present

## 2024-06-17 DIAGNOSIS — J3081 Allergic rhinitis due to animal (cat) (dog) hair and dander: Secondary | ICD-10-CM | POA: Diagnosis not present

## 2024-06-17 DIAGNOSIS — J3089 Other allergic rhinitis: Secondary | ICD-10-CM | POA: Diagnosis not present

## 2024-06-17 DIAGNOSIS — J301 Allergic rhinitis due to pollen: Secondary | ICD-10-CM | POA: Diagnosis not present

## 2024-06-24 DIAGNOSIS — J3089 Other allergic rhinitis: Secondary | ICD-10-CM | POA: Diagnosis not present

## 2024-06-24 DIAGNOSIS — J3081 Allergic rhinitis due to animal (cat) (dog) hair and dander: Secondary | ICD-10-CM | POA: Diagnosis not present

## 2024-06-24 DIAGNOSIS — J301 Allergic rhinitis due to pollen: Secondary | ICD-10-CM | POA: Diagnosis not present

## 2024-07-01 DIAGNOSIS — J301 Allergic rhinitis due to pollen: Secondary | ICD-10-CM | POA: Diagnosis not present

## 2024-07-01 DIAGNOSIS — J3089 Other allergic rhinitis: Secondary | ICD-10-CM | POA: Diagnosis not present

## 2024-07-01 DIAGNOSIS — J3081 Allergic rhinitis due to animal (cat) (dog) hair and dander: Secondary | ICD-10-CM | POA: Diagnosis not present

## 2024-07-08 DIAGNOSIS — J301 Allergic rhinitis due to pollen: Secondary | ICD-10-CM | POA: Diagnosis not present

## 2024-07-08 DIAGNOSIS — J3089 Other allergic rhinitis: Secondary | ICD-10-CM | POA: Diagnosis not present

## 2024-07-08 DIAGNOSIS — J3081 Allergic rhinitis due to animal (cat) (dog) hair and dander: Secondary | ICD-10-CM | POA: Diagnosis not present

## 2024-07-14 DIAGNOSIS — J301 Allergic rhinitis due to pollen: Secondary | ICD-10-CM | POA: Diagnosis not present

## 2024-07-14 DIAGNOSIS — J3089 Other allergic rhinitis: Secondary | ICD-10-CM | POA: Diagnosis not present

## 2024-07-14 DIAGNOSIS — J3081 Allergic rhinitis due to animal (cat) (dog) hair and dander: Secondary | ICD-10-CM | POA: Diagnosis not present
